# Patient Record
Sex: Male | Born: 2000 | Hispanic: No | Marital: Single | Smoking: Never smoker
Health system: Southern US, Community
[De-identification: ages and names within clinical notes are randomized; demographics above are authoritative.]

## PROBLEM LIST (undated history)

## (undated) DIAGNOSIS — E669 Obesity, unspecified: Secondary | ICD-10-CM

## (undated) DIAGNOSIS — M419 Scoliosis, unspecified: Secondary | ICD-10-CM

## (undated) HISTORY — DX: Scoliosis, unspecified: M41.9

## (undated) HISTORY — DX: Obesity, unspecified: E66.9

---
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
## 2019-12-14 IMAGING — CR DG CHEST 2V
2 series · 2 of 2 positions shown · non-contrast
Comparison: 12/17/2017 CT and plain film

CLINICAL DATA: Cough, fever, congestion

EXAM:
CHEST - 2 VIEW

[w chest pa]
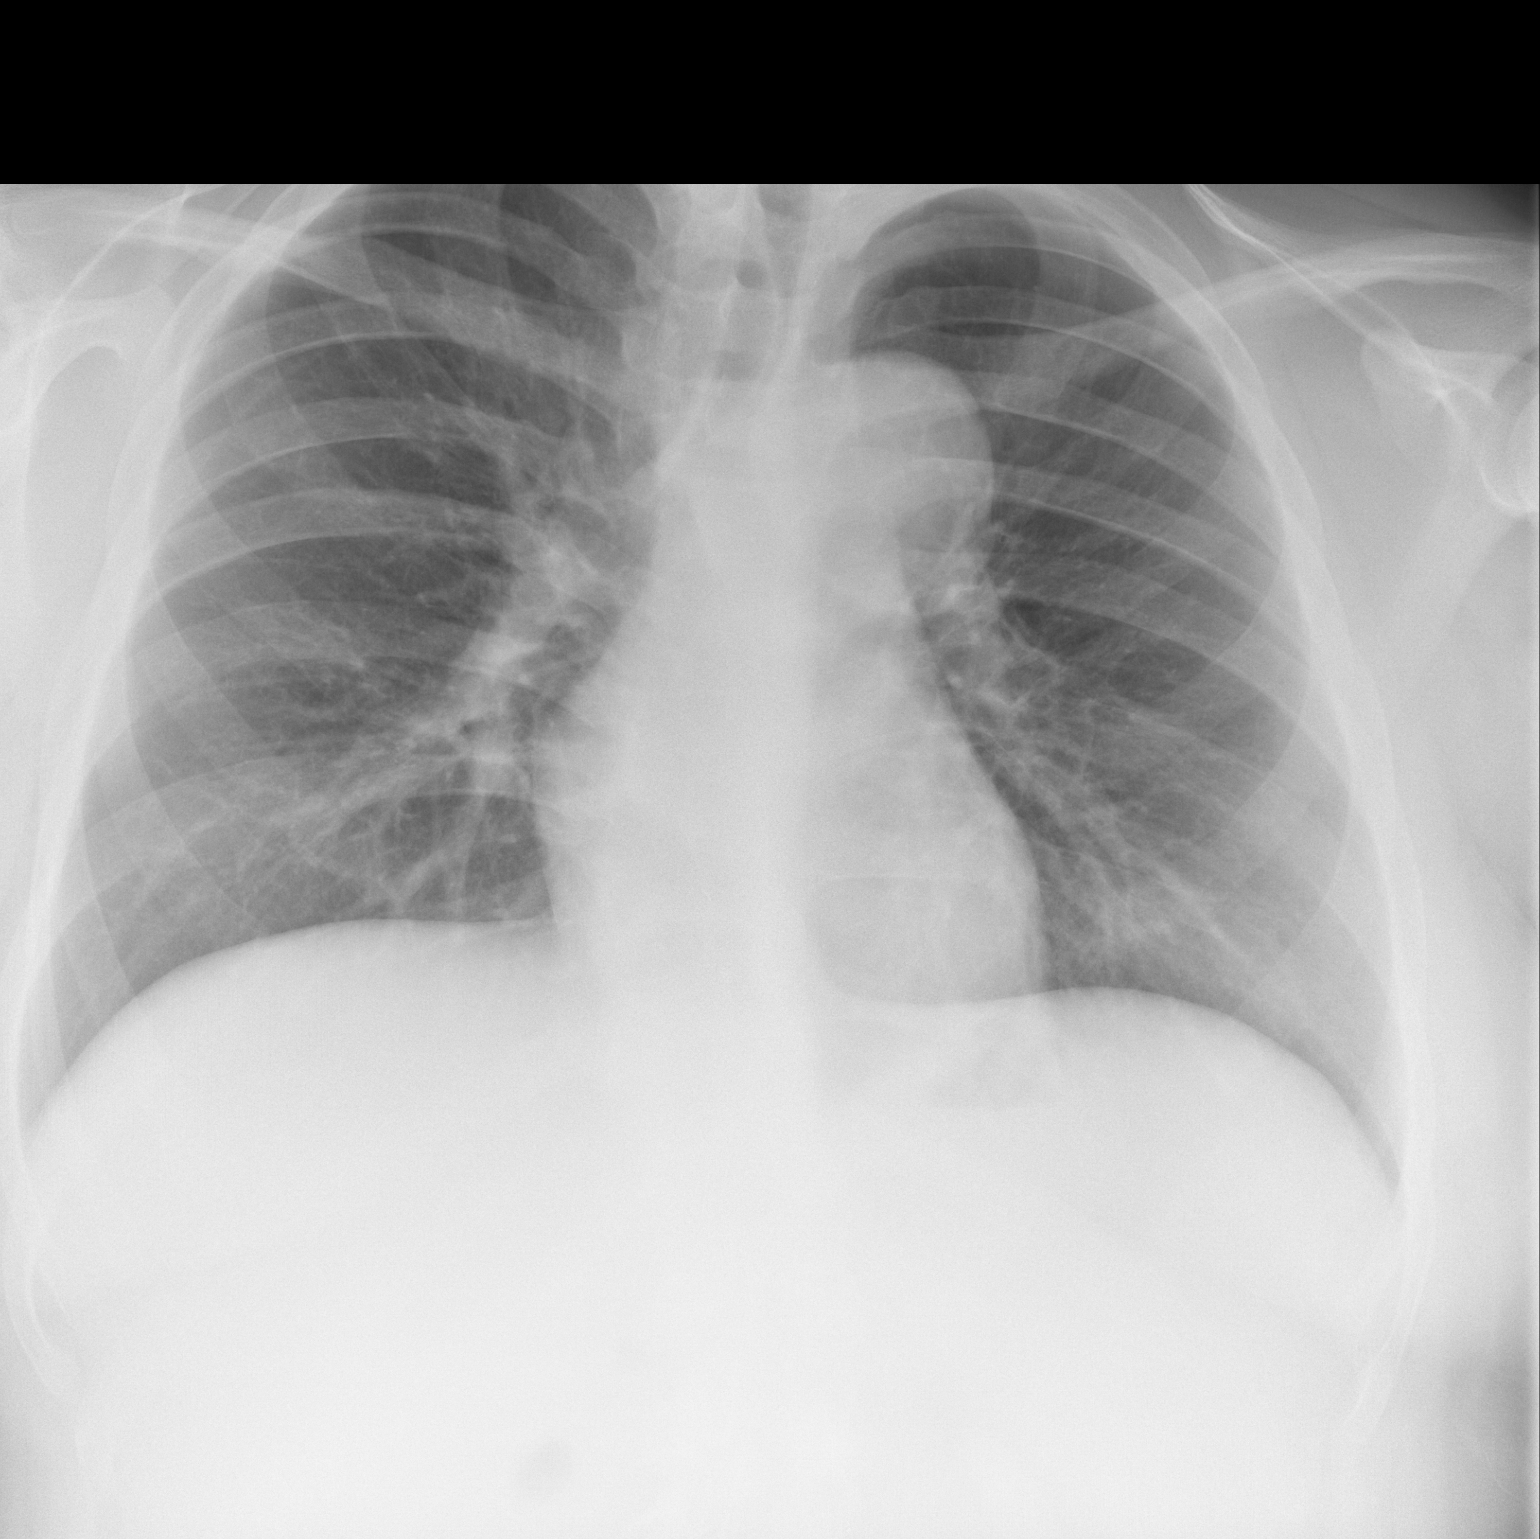

[w chest lat]
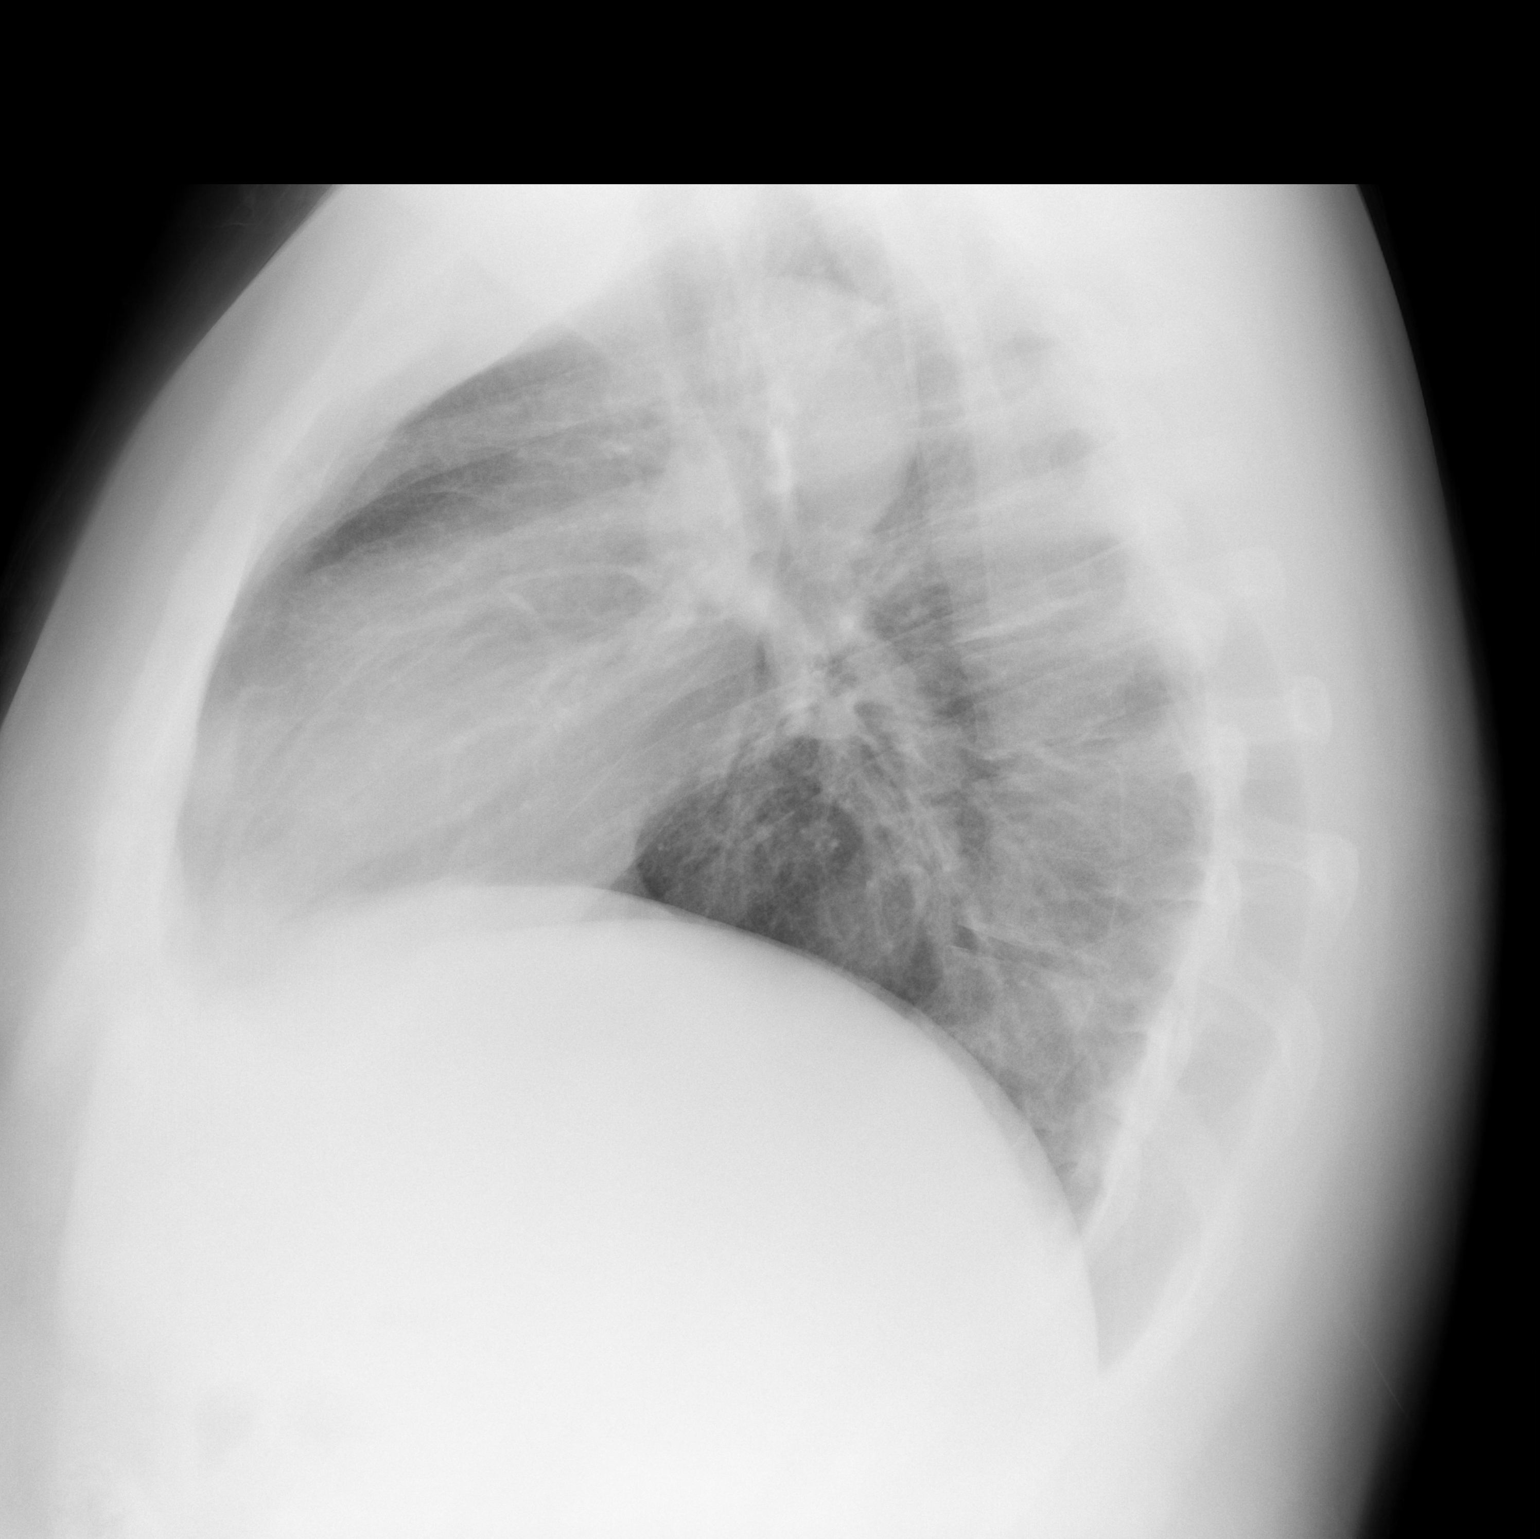

[2 of 2 positions shown; findings below may reference images not displayed]

FINDINGS: Mediastinal prominence in the region of the aortic arch is again
noted, shown on prior CT to represent a mediastinal mass, unchanged.
Heart is normal size. Lungs clear. No effusions or acute bony
abnormality.
IMPRESSION: Left mediastinal mass adjacent to the aortic arch is unchanged. No
acute cardiopulmonary disease.

## 2020-06-16 ENCOUNTER — Other Ambulatory Visit: Payer: Self-pay

## 2020-06-16 ENCOUNTER — Encounter (HOSPITAL_BASED_OUTPATIENT_CLINIC_OR_DEPARTMENT_OTHER): Payer: Self-pay | Admitting: *Deleted

## 2020-06-16 ENCOUNTER — Emergency Department (HOSPITAL_BASED_OUTPATIENT_CLINIC_OR_DEPARTMENT_OTHER)
Admission: EM | Admit: 2020-06-16 | Discharge: 2020-06-16 | Disposition: A | Discharge status: Home / Self Care | Payer: Medicaid Other | Attending: Emergency Medicine | Admitting: Emergency Medicine

## 2020-06-16 DIAGNOSIS — J039 Acute tonsillitis, unspecified: Secondary | ICD-10-CM | POA: Diagnosis not present

## 2020-06-16 DIAGNOSIS — J029 Acute pharyngitis, unspecified: Secondary | ICD-10-CM | POA: Diagnosis present

## 2020-06-16 LAB — MONONUCLEOSIS SCREEN: Mono Screen: NEGATIVE

## 2020-06-16 LAB — GROUP A STREP BY PCR: Group A Strep by PCR: NOT DETECTED

## 2020-06-16 MED ORDER — LIDOCAINE VISCOUS HCL 2 % MT SOLN: 15.0000 mL | Freq: Three times a day (TID) | OROMUCOSAL | 0 refills | Status: DC | PRN

## 2020-06-16 MED ORDER — LIDOCAINE VISCOUS HCL 2 % MT SOLN
15.0000 mL | Freq: Once | OROMUCOSAL | Status: AC
  Administered 2020-06-16: 15 mL via OROMUCOSAL
  Filled 2020-06-16: qty 15

## 2020-06-16 MED ORDER — IBUPROFEN 600 MG PO TABS: 600.0000 mg | ORAL_TABLET | Freq: Four times a day (QID) | ORAL | 0 refills | Status: DC | PRN

## 2020-06-16 MED ORDER — NAPROXEN 250 MG PO TABS
500.0000 mg | ORAL_TABLET | Freq: Once | ORAL | Status: AC
  Administered 2020-06-16: 500 mg via ORAL
  Filled 2020-06-16: qty 2

## 2020-06-16 MED ORDER — DEXAMETHASONE SODIUM PHOSPHATE 10 MG/ML IJ SOLN
10.0000 mg | Freq: Once | INTRAMUSCULAR | Status: AC
  Administered 2020-06-16: 10 mg via INTRAMUSCULAR
  Filled 2020-06-16: qty 1

## 2020-06-16 NOTE — ED Notes (Signed)
States 3 days ago began having a sore throat, poor appetite, low grade fever, decrease in energy level. Pain with swallowing, only been drinking tea and water. Visual inspection of throat indicates white pocket areas and redness also noted, uvula appears slightly swollen. States has a strong cough as well.

## 2020-06-16 NOTE — ED Provider Notes (Signed)
[facility] EMERGENCY DEPARTMENT Provider Note   CSN: [id number] Arrival date & time: 06/16/20  1944     History Chief Complaint  Patient presents with  . Sore Throat    Joe Cortez is a 20 y.o. male.  HPI    20 year old male comes in a chief complaint of sore throat. Patient has had a sore throat for the last 4 days.  He has had negative COVID test, flu test and strep test.  He continues to have significant discomfort in the back of his throat.  Pain does respond to Naprosyn.  Patient denies any sexual activity (no risk factors for STI).  He does indicate having some night sweats.  Patient has pain with swallowing, but able to swallow there is no difficulty in breathing.  Past Medical History:  Diagnosis Date  . Obesity   . Scoliosis 01/15/2014    Patient Active Problem List   Diagnosis Date Noted  . Hospital discharge follow-up 12/24/2017  . 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 Obesity 05/02/2015  . Scoliosis 01/15/2014    History reviewed. No pertinent surgical history.     No family history on file.  Social History   Tobacco Use  . Smoking status: Never Smoker  . Smokeless tobacco: Never Used  Substance Use Topics  . Alcohol use: No  . Drug use: No    Home Medications Prior to Admission medications   Medication Sig Start Date End Date Taking? Authorizing Provider  ibuprofen (ADVIL) 600 MG tablet Take 1 tablet (600 mg total) by mouth every 6 (six) hours as needed. 06/16/20  Yes Rozelia Catapano, MD  lidocaine (XYLOCAINE) 2 % solution Use as directed 15 mLs in the mouth or throat 3 (three) times daily as needed for mouth pain. 06/16/20  Yes Derwood Kaplan, MD    Allergies    Patient has no known allergies.  Review of Systems   Review of Systems  Constitutional: Positive for activity change.  HENT: Positive for sore throat. Negative for congestion.   Respiratory: Positive for cough. Negative for shortness of breath.   Cardiovascular: Negative for  chest pain.  Gastrointestinal: Negative for nausea and vomiting.  Allergic/Immunologic: Negative for immunocompromised state.  All other systems reviewed and are negative.   Physical Exam Updated Vital Signs BP 120/67   Pulse 89   Temp 99.1 F (37.3 C) (Oral)   Resp 16   Ht 6\' 5"  (1.956 m)   Wt (!) 141.5 kg   SpO2 100%   BMI 37.00 kg/m   Physical Exam Vitals and nursing note reviewed.  Constitutional:      Appearance: He is well-developed.  HENT:     Head: Atraumatic.     Nose:     Comments: Bilateral tonsillar exudate, left worse than right.    Mouth/Throat:     Mouth: Oral lesions present.     Pharynx: Oropharyngeal exudate and posterior oropharyngeal erythema present.  Cardiovascular:     Rate and Rhythm: Normal rate.  Pulmonary:     Effort: Pulmonary effort is normal.     Breath sounds: No stridor.  Musculoskeletal:     Cervical back: Neck supple.  Lymphadenopathy:     Cervical: Cervical adenopathy present.  Skin:    General: Skin is warm.  Neurological:     Mental Status: He is alert and oriented to person, place, and time.     ED Results / Procedures / Treatments   Labs (all labs ordered are listed, but only abnormal results  are displayed) Labs Reviewed  GROUP A STREP BY PCR  MONONUCLEOSIS SCREEN    EKG None  Radiology No results found.  Procedures Procedures   Medications Ordered in ED Medications  dexamethasone (DECADRON) injection 10 mg (10 mg Intramuscular Given 06/16/20 2237)  lidocaine (XYLOCAINE) 2 % viscous mouth solution 15 mL (15 mLs Mouth/Throat Given 06/16/20 2237)  naproxen (NAPROSYN) tablet 500 mg (500 mg Oral Given 06/16/20 2237)    ED Course  I have reviewed the triage vital signs and the nursing notes.  Pertinent labs & imaging results that were available during my care of the patient were reviewed by me and considered in my medical decision making (see chart for details).    MDM Rules/Calculators/A&P                           20 year old comes in a chief complaint of sore throat.  He is already tested negative for COVID, flu and had a rapid strep test in triage which is also negative.  He has cough, which makes strep unlikely.  Mono test added.  No STI risk factors, patient does not want to be tested for GC-chlamydia.  No red flags suggesting deep space infection, patient is immunocompetent.  Supportive treatment for now.  Advised strict ER return precautions.  Patient is already on antibiotics, advised that he finished the course that he is started.  Final Clinical Impression(s) / ED Diagnoses Final diagnoses:  Tonsillitis    Rx / DC Orders ED Discharge Orders         Ordered    ibuprofen (ADVIL) 600 MG tablet  Every 6 hours PRN        06/16/20 2240    lidocaine (XYLOCAINE) 2 % solution  3 times daily PRN        06/16/20 2240           Derwood Kaplan, MD 06/16/20 2245

## 2020-06-16 NOTE — ED Triage Notes (Signed)
C/o sore throat x 3 days.

## 2020-06-16 NOTE — Discharge Instructions (Signed)
You are seen in the ER for sore throat.  You have what appears to be tonsillitis.  Read the instructions provided on managing this condition.  Take the medications prescribed and complete salt water gargle at least twice a day.  In general, the care is supportive and your body will recover.  Return to the ER if you start having difficulty in breathing, drooling, severe headaches, confusion, inability to keep fluids down.

## 2020-09-07 ENCOUNTER — Telehealth: Payer: Self-pay

## 2020-09-07 NOTE — Telephone Encounter (Signed)
Called twice- no answer- LMOM.  Will try back, asked him to please answer the phone

## 2020-09-07 NOTE — Telephone Encounter (Signed)
Pt has been added to schedule ?

## 2020-09-07 NOTE — Telephone Encounter (Signed)
I called pt back and did reach him at another number He fees "like something sitting on my chest" He has noted this for 4 days He felt fatigued He is congested and has some cough  He is coughing up some mucus- he coughed hard today and there was a little blood in his spit He did take a covid test twice - negative - most recent test was last night  Offered to have him seen in the ER but he declines for now He would like to be seen tomorrow- will add him to my schedule at 10am

## 2020-09-07 NOTE — Progress Notes (Deleted)
[facility] [address] [place], [place] [address] [phone]  Date:  09/08/2020   Name:  Joe Cortez   DOB:  2000/03/03   MRN:  [id number]  PCP:  Pearline Cables, MD    Chief Complaint: No chief complaint on file.   History of Present Illness:  Joe Cortez is a 20 y.o. 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  I spoke with him on the phone as below-he also agreed to seek care at the ER if getting worse overnight: I called pt back and did reach him at another number He fees "like something sitting on my chest" He has noted this for 4 days He felt fatigued He is congested and has some cough He is coughing up some mucus- he coughed hard today and there was a little blood in his spit He did take a covid test twice - negative - most recent test was last night   Offered to have him seen in the ER but he declines for now He would like to be seen tomorrow- will add him to my schedule at 10am  Patient Active Problem List   Diagnosis Date Noted   Hospital discharge follow-up 12/24/2017   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  Social History   Tobacco Use   Smoking status: Never   Smokeless tobacco: Never  Substance Use Topics   Alcohol use: No   Drug use: No    No family history on file.  No Known Allergies  Medication list has been reviewed and updated.  Current Outpatient  Medications on File Prior to Visit  Medication Sig Dispense Refill   ibuprofen (ADVIL) 600 MG tablet Take 1 tablet (600 mg total) by mouth every 6 (six) hours as needed. 30 tablet 0   lidocaine (XYLOCAINE) 2 % solution Use as directed 15 mLs in the mouth or throat 3 (three) times daily as needed for mouth pain. 100 mL 0   No current facility-administered medications on file prior to visit.    Review of Systems:  As per HPI- otherwise negative.   Physical Examination: There were no vitals filed for this visit. There were no vitals filed for this visit. There is no height or weight on file to calculate BMI. Ideal Body Weight:    GEN: no acute distress. HEENT: Atraumatic, Normocephalic.  Ears and Nose: No external deformity. CV: RRR, No M/G/R. No JVD. No thrill. No extra heart sounds. PULM: CTA B, no wheezes, crackles, rhonchi. No retractions. No resp. distress. No accessory muscle use. ABD: S, NT, ND, +BS. No rebound. No HSM. EXTR: No c/c/e PSYCH: Normally interactive. Conversant.    Assessment and Plan: *** This visit occurred during the SARS-CoV-2 public health emergency.  Safety protocols were in place, including screening questions prior to the visit, additional usage of staff PPE, and extensive cleaning of exam room while observing appropriate contact  time as indicated for disinfecting solutions.   Signed Lamar Blinks, MD

## 2020-09-07 NOTE — Telephone Encounter (Signed)
Patient's mother called for appointment due to chest tinghness x 2 days.  Patient's mother advised Dr. Patsy Lager will like to talk to patient since he is an adult. Per mother he can be called at [phone].

## 2020-09-08 ENCOUNTER — Ambulatory Visit: Payer: Medicaid Other | Admitting: Family Medicine

## 2020-09-08 NOTE — Telephone Encounter (Signed)
Father called to cancel. Informed of Cancelation fee. He stated not to cancel and he would have his son call back

## 2020-09-08 NOTE — Telephone Encounter (Signed)
Called pt as he did not appear to his appt today.  He states that his mother was supposed to cancel this appt, he woke up this am feeling fine so he no longer wishes to/ needs to be sen

## 2020-09-25 ENCOUNTER — Emergency Department (HOSPITAL_BASED_OUTPATIENT_CLINIC_OR_DEPARTMENT_OTHER)
Admission: EM | Admit: 2020-09-25 | Discharge: 2020-09-25 | Disposition: A | Discharge status: Home / Self Care | Payer: Medicaid Other | Attending: Emergency Medicine | Admitting: Emergency Medicine

## 2020-09-25 ENCOUNTER — Encounter (HOSPITAL_BASED_OUTPATIENT_CLINIC_OR_DEPARTMENT_OTHER): Payer: Self-pay

## 2020-09-25 ENCOUNTER — Other Ambulatory Visit: Payer: Self-pay

## 2020-09-25 DIAGNOSIS — M7918 Myalgia, other site: Secondary | ICD-10-CM | POA: Diagnosis present

## 2020-09-25 DIAGNOSIS — G5701 Lesion of sciatic nerve, right lower limb: Secondary | ICD-10-CM | POA: Diagnosis not present

## 2020-09-25 MED ORDER — METHOCARBAMOL 500 MG PO TABS: 500.0000 mg | ORAL_TABLET | Freq: Two times a day (BID) | ORAL | 0 refills | Status: DC

## 2020-09-25 MED ORDER — PREDNISONE 10 MG PO TABS: 40.0000 mg | ORAL_TABLET | Freq: Every day | ORAL | 0 refills | Status: AC

## 2020-09-25 NOTE — ED Notes (Signed)
Pt discharged to home. Discharge instructions have been discussed with patient and/or family members. Pt verbally acknowledges understanding d/c instructions, and endorses comprehension to checkout at registration before leaving.  °

## 2020-09-25 NOTE — ED Provider Notes (Addendum)
[facility] EMERGENCY DEPARTMENT Provider Note   CSN: [id number] Arrival date & time: 09/25/20  1236     History Chief Complaint  Patient presents with   Leg Pain    Joe Cortez is a 20 y.o. male.  HPI Patient is a 20 year old male presenting today with right gluteal pain and weakness for the past 2 months.  He states that he plays football regularly with a league.  He has not played in about 2 months but it has been about 2 months since he is start having symptoms.  He states that he also drives a lot but rarely drives over 2 or 3 hours within a day or 2.  States that he drives around town relatively frequently.  He usually picks up his walks but states that he has had more discomfort in his right [place] with sitting recently seems to have discomfort that radiates down his right leg does not past the knee however.  He states that he has just over the past week or so felt some tingling sensations in his right leg.  Denies any weakness or numbness.  Denies any chest pain or shortness of breath no history of VTE.  Denies any trauma  No recent surgeries, hospitalization, long travel, hemoptysis, estrogen containing OCP, cancer history.  No unilateral leg swelling.  No history of PE or VTE.      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 No pertinent surgical history.     No family history on file.  Social History   Tobacco Use   Smoking status: Never   Smokeless tobacco: Never  Substance Use Topics   Alcohol use: No   Drug use: No    Home Medications Prior to Admission medications   Medication Sig Start Date End Date Taking? Authorizing Provider  methocarbamol (ROBAXIN) 500 MG tablet Take 1 tablet (500 mg total) by mouth 2 (two) times  daily. 09/25/20  Yes Kismet Facemire S, PA  predniSONE (DELTASONE) 10 MG tablet Take 4 tablets (40 mg total) by mouth daily for 3 days. 09/25/20 09/28/20 Yes Ethelmae Ringel S, PA  ibuprofen (ADVIL) 600 MG tablet Take 1 tablet (600 mg total) by mouth every 6 (six) hours as needed. 06/16/20   Derwood Kaplan, MD  lidocaine (XYLOCAINE) 2 % solution Use as directed 15 mLs in the mouth or throat 3 (three) times daily as needed for mouth pain. 06/16/20   Derwood Kaplan, MD    Allergies    Patient has no known allergies.  Review of Systems   Review of Systems  Constitutional:  Negative for chills and fever.  HENT:  Negative for congestion.   Respiratory:  Negative for shortness of breath.   Cardiovascular:  Negative for chest pain.  Gastrointestinal:  Negative for abdominal pain.  Musculoskeletal:  Negative for neck pain.       Right gluteal/right leg pain   Physical Exam Updated Vital Signs BP 121/77 (BP Location: Left Arm)   Pulse 93   Temp 98.4 F (36.9 C) (Oral)   Resp 18   Ht 6\' 5"  (1.956 m)   Wt (!) 151 kg   SpO2 95%   BMI 39.49 kg/m   Physical Exam Vitals and nursing note reviewed.  Constitutional:  General: He is not in acute distress.    Appearance: Normal appearance. He is not ill-appearing.  HENT:     Head: Normocephalic and atraumatic.  Eyes:     General: No scleral icterus.       Right eye: No discharge.        Left eye: No discharge.     Conjunctiva/sclera: Conjunctivae normal.  Cardiovascular:     Comments: Bilateral DP pulses 3+ and symmetric. Pulmonary:     Effort: Pulmonary effort is normal.     Breath sounds: No stridor.  Musculoskeletal:     Comments: Bilateral legs are symmetric.  No calf tenderness.  Skin:    General: Skin is warm and dry.     Comments: Since palpation of the right gluteal muscle.  Positive straight leg raise with pain radiating down past the knee.  Strength symmetric bilateral lower extremities.  Gait normal.  No disequilibrium  evident.  Neurological:     Mental Status: He is alert and oriented to person, place, and time. Mental status is at baseline.    ED Results / Procedures / Treatments   Labs (all labs ordered are listed, but only abnormal results are displayed) Labs Reviewed - No data to display  EKG None  Radiology No results found.  Procedures Procedures   Medications Ordered in ED Medications - No data to display  ED Course  I have reviewed the triage vital signs and the nursing notes.  Pertinent labs & imaging results that were available during my care of the patient were reviewed by me and considered in my medical decision making (see chart for details).    MDM Rules/Calculators/A&P                           Patient is 20 year old male presented today with radicular symptoms down right leg.  No central low back pain.  Ambulatory physical exam overall reassuring.  Does have some evidence of radicular pain with positive straight leg raise.  No central back pain making discrimination less likely.  Broad differential for back pain considered includes malignancy, disc herniation, spinal epidural abscess, spinal fracture, cauda equina, pyelonephritis, kidney stone, AAA, AD, pancreatitis, PE and PTX.   History without symptoms of urinary or stool retention or incontinence, neurologic changes such as sensation change or weakness lower extremities, coagulopathy or blood thinner use, is not elderly or with history of osteoporosis, denies any history of cancer, fever, IV drug use, weight changes (unexplained), or prolonged steroid use.   Physical exam most consistent with muscular strain versus piriformis versus MSK pathology. Doubt cauda equina or disc herniation d/t lack of saddle anesthesia/bowel or bladder incontinence or urinary retention, normal gait and reassuring physical examination without neurologic deficits.   History is not supportive of kidney stone, AAA, AD, pancreatitis, PE or PTX.  Patient has no CVA tenderness or urinary sx to suggest pyelonephritis or kidney stone.   Will manage patient conservatively at this time. NSAIDs, back exercises/stretches, heat therapy and follow up with PCP if symptoms do not resolve in 3-4 weeks. Patient offered muscle relaxer for comfort at night. Counseled on need to return to ED for fever, worsening or concerning symptoms. Patient agreeable to plan and states understanding of follow up plans and return precautions.    Vital signs within normal limits.  Discussed with patient the need to not use right back pocket for wallet or any other devices.  Recommend stretching warm compresses Tylenol  ibuprofen.  Brief course of prednisone and muscle relaxer.  He will follow-up with [facility].  May benefit from physical therapy.  Given return precautions occluding lateral leg swelling concerning for DVT however he has no evidence of this today and have low suspicion for this.  Final Clinical Impression(s) / ED Diagnoses Final diagnoses:  Piriformis syndrome of right side    Rx / DC Orders ED Discharge Orders          Ordered    predniSONE (DELTASONE) 10 MG tablet  Daily        09/25/20 1523    methocarbamol (ROBAXIN) 500 MG tablet  2 times daily        09/25/20 1523             Gailen Shelter, [place] 09/25/20 1528    Gailen Shelter, [place] 09/25/20 1530    Charlynne Pander, MD 09/25/20 [phone]

## 2020-09-25 NOTE — Discharge Instructions (Addendum)
Your symptoms today are very concerning for piriformis syndrome or a muscle strain in your gluteus muscle.  Please use Tylenol and ibuprofen as discussed below.  Warm compresses, gentle stretching, patient does that help stretch that area that we discussed.  Follow-up with [facility].  He may benefit from physical therapy.  If you experience leg swelling return to the ER immediately as we discussed a very low suspicion for blood clot however swelling in 1 leg would be concerning for this.  Please use Tylenol or ibuprofen for pain.  You may use 600 mg ibuprofen every 6 hours or 1000 mg of Tylenol every 6 hours.  You may choose to alternate between the 2.  This would be most effective.  Not to exceed 4 g of Tylenol within 24 hours.  Not to exceed 3200 mg ibuprofen 24 hours.    I have also prescribed you a muscle relaxer and medications all prednisone which she will take for 3 days to try to take the swelling down.  It appears that you are already established with a primary care provider.  Please follow-up with them I have attached their information to your discharge instructions.

## 2020-09-25 NOTE — ED Triage Notes (Signed)
Pt arrives with c/o pain to right leg starting behind glute about a month ago pt reports over the past week his leg started feeling tingly.

## 2020-12-05 NOTE — Progress Notes (Deleted)
[facility] [address], [address] [place], [place] [address] [phone]  Date:  12/07/2020   Name:  Jasson Siegmann   DOB:  2000/08/06   MRN:  [id number]  PCP:  Pearline Cables, MD    Chief Complaint: No chief complaint on file.   History of Present Illness:  Jeshurun Oaxaca is a 20 y.o. 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  Social History   Tobacco Use   Smoking status: Never   Smokeless tobacco: Never  Substance Use Topics   Alcohol use: No   Drug use: No    No family history on file.  No Known Allergies  Medication list has been reviewed and updated.  Current Outpatient Medications on File Prior to Visit  Medication Sig Dispense Refill   ibuprofen (ADVIL) 600 MG tablet Take 1 tablet (600 mg total) by mouth every 6 (six) hours as needed. 30 tablet 0   lidocaine (XYLOCAINE) 2 % solution Use as directed 15 mLs in the mouth or throat 3 (three) times daily as needed for mouth pain. 100 mL 0   methocarbamol (ROBAXIN) 500 MG tablet Take 1 tablet (500 mg total) by mouth 2 (two) times daily. 20 tablet 0   No current facility-administered medications on file prior to visit.    Review of Systems:  As per HPI- otherwise negative.   Physical Examination: There were no vitals filed for this visit. There were no vitals filed for this visit. There is no height or weight on file to calculate BMI. Ideal Body Weight:    GEN: no  acute distress. HEENT: Atraumatic, Normocephalic.  Ears and Nose: No external deformity. CV: RRR, No M/G/R. No JVD. No thrill. No extra heart sounds. PULM: CTA B, no wheezes, crackles, rhonchi. No retractions. No resp. distress. No accessory muscle use. ABD: S, NT, ND, +BS. No rebound. No HSM. EXTR: No c/c/e PSYCH: Normally interactive. Conversant.    Assessment and Plan: ***  Signed Abbe Amsterdam, MD

## 2020-12-07 ENCOUNTER — Encounter: Payer: Medicaid Other | Admitting: Family Medicine

## 2020-12-08 ENCOUNTER — Encounter: Payer: Self-pay | Admitting: Family Medicine

## 2021-01-25 ENCOUNTER — Emergency Department (HOSPITAL_COMMUNITY): Payer: Medicaid Other | Admitting: Anesthesiology

## 2021-01-25 ENCOUNTER — Encounter (HOSPITAL_COMMUNITY)
Admission: EM | Disposition: A | Discharge status: Home / Self Care | Payer: Self-pay | Source: Home / Self Care | Attending: Emergency Medicine

## 2021-01-25 ENCOUNTER — Other Ambulatory Visit: Payer: Self-pay

## 2021-01-25 ENCOUNTER — Encounter (HOSPITAL_BASED_OUTPATIENT_CLINIC_OR_DEPARTMENT_OTHER): Payer: Self-pay | Admitting: Emergency Medicine

## 2021-01-25 ENCOUNTER — Emergency Department (HOSPITAL_BASED_OUTPATIENT_CLINIC_OR_DEPARTMENT_OTHER): Payer: Medicaid Other

## 2021-01-25 ENCOUNTER — Ambulatory Visit (HOSPITAL_BASED_OUTPATIENT_CLINIC_OR_DEPARTMENT_OTHER)
Admission: EM | Admit: 2021-01-25 | Discharge: 2021-01-25 | Disposition: A | Discharge status: Home / Self Care | Payer: Medicaid Other | Attending: Surgery | Admitting: Surgery

## 2021-01-25 DIAGNOSIS — K353 Acute appendicitis with localized peritonitis, without perforation or gangrene: Secondary | ICD-10-CM | POA: Diagnosis present

## 2021-01-25 DIAGNOSIS — K3533 Acute appendicitis with perforation and localized peritonitis, with abscess: Secondary | ICD-10-CM | POA: Diagnosis not present

## 2021-01-25 DIAGNOSIS — Z6838 Body mass index (BMI) 38.0-38.9, adult: Secondary | ICD-10-CM | POA: Insufficient documentation

## 2021-01-25 DIAGNOSIS — Z20822 Contact with and (suspected) exposure to covid-19: Secondary | ICD-10-CM | POA: Insufficient documentation

## 2021-01-25 DIAGNOSIS — E669 Obesity, unspecified: Secondary | ICD-10-CM | POA: Insufficient documentation

## 2021-01-25 HISTORY — PX: LAPAROSCOPIC APPENDECTOMY: SHX408

## 2021-01-25 LAB — COMPREHENSIVE METABOLIC PANEL
ALT: 24 U/L (ref 0–44)
AST: 19 U/L (ref 15–41)
Albumin: 4.2 g/dL (ref 3.5–5.0)
Alkaline Phosphatase: 59 U/L (ref 38–126)
Anion gap: 11 (ref 5–15)
BUN: 20 mg/dL (ref 6–20)
CO2: 23 mmol/L (ref 22–32)
Calcium: 9.3 mg/dL (ref 8.9–10.3)
Chloride: 102 mmol/L (ref 98–111)
Creatinine, Ser: 0.78 mg/dL (ref 0.61–1.24)
GFR, Estimated: >60 mL/min (ref >60)
Glucose, Bld: 104 mg/dL — ABNORMAL HIGH (ref 70–99)
Potassium: 3.7 mmol/L (ref 3.5–5.1)
Sodium: 136 mmol/L (ref 135–145)
Total Bilirubin: 0.7 mg/dL (ref 0.3–1.2)
Total Protein: 7.9 g/dL (ref 6.5–8.1)

## 2021-01-25 LAB — CBC WITH DIFFERENTIAL/PLATELET
Abs Immature Granulocytes: 0.02 10*3/uL (ref 0.00–0.07)
Basophils Absolute: 0 10*3/uL (ref 0.0–0.1)
Basophils Relative: 0 %
Eosinophils Absolute: 0 10*3/uL (ref 0.0–0.5)
Eosinophils Relative: 0 %
HCT: 54.8 % — ABNORMAL HIGH (ref 39.0–52.0)
Hemoglobin: 18.1 g/dL — ABNORMAL HIGH (ref 13.0–17.0)
Immature Granulocytes: 0 %
Lymphocytes Relative: 17 %
Lymphs Abs: 1.4 10*3/uL (ref 0.7–4.0)
MCH: 26.2 pg (ref 26.0–34.0)
MCHC: 33 g/dL (ref 30.0–36.0)
MCV: 79.2 fL — ABNORMAL LOW (ref 80.0–100.0)
Monocytes Absolute: 0.6 10*3/uL (ref 0.1–1.0)
Monocytes Relative: 7 %
Neutro Abs: 6.3 10*3/uL (ref 1.7–7.7)
Neutrophils Relative %: 76 %
Platelets: 184 10*3/uL (ref 150–400)
RBC: 6.92 MIL/uL — ABNORMAL HIGH (ref 4.22–5.81)
RDW: 13.8 % (ref 11.5–15.5)
WBC: 8.4 10*3/uL (ref 4.0–10.5)
nRBC: 0 % (ref 0.0–0.2)

## 2021-01-25 LAB — URINALYSIS, ROUTINE W REFLEX MICROSCOPIC
Bilirubin Urine: NEGATIVE
Glucose, UA: NEGATIVE mg/dL
Ketones, ur: NEGATIVE mg/dL
Leukocytes,Ua: NEGATIVE
Nitrite: NEGATIVE
Protein, ur: NEGATIVE mg/dL
Specific Gravity, Urine: 1.03 (ref 1.005–1.030)
pH: 6 (ref 5.0–8.0)

## 2021-01-25 LAB — LIPASE, BLOOD: Lipase: 39 U/L (ref 11–51)

## 2021-01-25 LAB — URINALYSIS, MICROSCOPIC (REFLEX)

## 2021-01-25 LAB — RESP PANEL BY RT-PCR (FLU A&B, COVID) ARPGX2
Influenza A by PCR: NEGATIVE
Influenza B by PCR: NEGATIVE
SARS Coronavirus 2 by RT PCR: NEGATIVE

## 2021-01-25 SURGERY — APPENDECTOMY, LAPAROSCOPIC
Anesthesia: General | Site: Abdomen

## 2021-01-25 MED ORDER — ONDANSETRON HCL 4 MG/2ML IJ SOLN
INTRAMUSCULAR | Status: DC | PRN
  Administered 2021-01-25: 4 mg via INTRAVENOUS

## 2021-01-25 MED ORDER — CHLORHEXIDINE GLUCONATE 0.12 % MT SOLN
OROMUCOSAL | Status: AC
  Administered 2021-01-25: 13:00:00 15 mL via OROMUCOSAL
  Filled 2021-01-25: qty 15

## 2021-01-25 MED ORDER — IBUPROFEN 600 MG PO TABS: 600.0000 mg | ORAL_TABLET | Freq: Four times a day (QID) | ORAL | 0 refills | Status: DC | PRN

## 2021-01-25 MED ORDER — SCOPOLAMINE 1 MG/3DAYS TD PT72
1.0000 | MEDICATED_PATCH | TRANSDERMAL | Status: DC
  Administered 2021-01-25: 14:00:00 1.5 mg via TRANSDERMAL
  Filled 2021-01-25: qty 1

## 2021-01-25 MED ORDER — ROCURONIUM BROMIDE 10 MG/ML (PF) SYRINGE
PREFILLED_SYRINGE | INTRAVENOUS | Status: DC | PRN
  Administered 2021-01-25: 100 mg via INTRAVENOUS

## 2021-01-25 MED ORDER — SUGAMMADEX SODIUM 200 MG/2ML IV SOLN
INTRAVENOUS | Status: DC | PRN
  Administered 2021-01-25: 500 mg via INTRAVENOUS

## 2021-01-25 MED ORDER — DEXAMETHASONE SODIUM PHOSPHATE 10 MG/ML IJ SOLN
INTRAMUSCULAR | Status: DC | PRN
  Administered 2021-01-25: 10 mg via INTRAVENOUS

## 2021-01-25 MED ORDER — SODIUM CHLORIDE 0.9 % IV SOLN
2.0000 g | Freq: Once | INTRAVENOUS | Status: AC
  Administered 2021-01-25: 11:00:00 2 g via INTRAVENOUS
  Filled 2021-01-25: qty 20

## 2021-01-25 MED ORDER — FENTANYL CITRATE (PF) 250 MCG/5ML IJ SOLN
INTRAMUSCULAR | Status: AC
  Filled 2021-01-25: qty 5

## 2021-01-25 MED ORDER — ORAL CARE MOUTH RINSE: 15.0000 mL | Freq: Once | OROMUCOSAL | Status: AC

## 2021-01-25 MED ORDER — ACETAMINOPHEN 500 MG PO TABS
1000.0000 mg | ORAL_TABLET | Freq: Once | ORAL | Status: AC
  Administered 2021-01-25: 13:00:00 1000 mg via ORAL
  Filled 2021-01-25: qty 2

## 2021-01-25 MED ORDER — FENTANYL CITRATE PF 50 MCG/ML IJ SOSY
50.0000 ug | PREFILLED_SYRINGE | Freq: Once | INTRAMUSCULAR | Status: AC
  Administered 2021-01-25: 11:00:00 50 ug via INTRAVENOUS
  Filled 2021-01-25: qty 1

## 2021-01-25 MED ORDER — FENTANYL CITRATE (PF) 250 MCG/5ML IJ SOLN
INTRAMUSCULAR | Status: DC | PRN
  Administered 2021-01-25 (×2): 50 ug via INTRAVENOUS
  Administered 2021-01-25: 100 ug via INTRAVENOUS
  Administered 2021-01-25 (×3): 50 ug via INTRAVENOUS

## 2021-01-25 MED ORDER — APREPITANT 40 MG PO CAPS
40.0000 mg | ORAL_CAPSULE | Freq: Once | ORAL | Status: AC
  Administered 2021-01-25: 14:00:00 40 mg via ORAL
  Filled 2021-01-25 (×2): qty 1

## 2021-01-25 MED ORDER — DEXMEDETOMIDINE (PRECEDEX) IN NS 20 MCG/5ML (4 MCG/ML) IV SYRINGE
PREFILLED_SYRINGE | INTRAVENOUS | Status: AC
  Filled 2021-01-25: qty 10

## 2021-01-25 MED ORDER — PROPOFOL 10 MG/ML IV BOLUS
INTRAVENOUS | Status: AC
  Filled 2021-01-25: qty 20

## 2021-01-25 MED ORDER — ONDANSETRON HCL 4 MG/2ML IJ SOLN
INTRAMUSCULAR | Status: AC
  Filled 2021-01-25: qty 2

## 2021-01-25 MED ORDER — PROMETHAZINE HCL 25 MG/ML IJ SOLN
6.2500 mg | Freq: Once | INTRAMUSCULAR | Status: AC
  Administered 2021-01-25: 16:00:00 6.25 mg via INTRAVENOUS

## 2021-01-25 MED ORDER — LACTATED RINGERS IV SOLN: INTRAVENOUS | Status: DC

## 2021-01-25 MED ORDER — DEXMEDETOMIDINE (PRECEDEX) IN NS 20 MCG/5ML (4 MCG/ML) IV SYRINGE
PREFILLED_SYRINGE | INTRAVENOUS | Status: DC | PRN
  Administered 2021-01-25: 8 ug via INTRAVENOUS
  Administered 2021-01-25: 4 ug via INTRAVENOUS
  Administered 2021-01-25: 20 ug via INTRAVENOUS
  Administered 2021-01-25: 8 ug via INTRAVENOUS

## 2021-01-25 MED ORDER — ACETAMINOPHEN 500 MG PO TABS: 500.0000 mg | ORAL_TABLET | Freq: Four times a day (QID) | ORAL | Status: DC | PRN

## 2021-01-25 MED ORDER — SUGAMMADEX SODIUM 500 MG/5ML IV SOLN
INTRAVENOUS | Status: AC
  Filled 2021-01-25: qty 5

## 2021-01-25 MED ORDER — TRAMADOL HCL 50 MG PO TABS: 50.0000 mg | ORAL_TABLET | Freq: Four times a day (QID) | ORAL | 0 refills | Status: AC | PRN

## 2021-01-25 MED ORDER — ROCURONIUM BROMIDE 10 MG/ML (PF) SYRINGE
PREFILLED_SYRINGE | INTRAVENOUS | Status: AC
  Filled 2021-01-25: qty 10

## 2021-01-25 MED ORDER — PROMETHAZINE HCL 25 MG/ML IJ SOLN
INTRAMUSCULAR | Status: AC
  Filled 2021-01-25: qty 1

## 2021-01-25 MED ORDER — PHENYLEPHRINE 40 MCG/ML (10ML) SYRINGE FOR IV PUSH (FOR BLOOD PRESSURE SUPPORT)
PREFILLED_SYRINGE | INTRAVENOUS | Status: AC
  Filled 2021-01-25: qty 10

## 2021-01-25 MED ORDER — FENTANYL CITRATE PF 50 MCG/ML IJ SOSY
50.0000 ug | PREFILLED_SYRINGE | Freq: Once | INTRAMUSCULAR | Status: AC
  Administered 2021-01-25: 10:00:00 50 ug via INTRAVENOUS
  Filled 2021-01-25: qty 1

## 2021-01-25 MED ORDER — MIDAZOLAM HCL 2 MG/2ML IJ SOLN
INTRAMUSCULAR | Status: AC
  Filled 2021-01-25: qty 2

## 2021-01-25 MED ORDER — 0.9 % SODIUM CHLORIDE (POUR BTL) OPTIME
TOPICAL | Status: DC | PRN
  Administered 2021-01-25: 15:00:00 1000 mL

## 2021-01-25 MED ORDER — MIDAZOLAM HCL 2 MG/2ML IJ SOLN
INTRAMUSCULAR | Status: DC | PRN
  Administered 2021-01-25: 2 mg via INTRAVENOUS

## 2021-01-25 MED ORDER — METRONIDAZOLE 500 MG/100ML IV SOLN
500.0000 mg | Freq: Once | INTRAVENOUS | Status: AC
  Administered 2021-01-25: 11:00:00 500 mg via INTRAVENOUS
  Filled 2021-01-25: qty 100

## 2021-01-25 MED ORDER — LIDOCAINE 2% (20 MG/ML) 5 ML SYRINGE
INTRAMUSCULAR | Status: AC
  Filled 2021-01-25: qty 5

## 2021-01-25 MED ORDER — ONDANSETRON HCL 4 MG/2ML IJ SOLN
4.0000 mg | Freq: Once | INTRAMUSCULAR | Status: AC
  Administered 2021-01-25: 10:00:00 4 mg via INTRAVENOUS
  Filled 2021-01-25: qty 2

## 2021-01-25 MED ORDER — CHLORHEXIDINE GLUCONATE 0.12 % MT SOLN: 15.0000 mL | Freq: Once | OROMUCOSAL | Status: AC

## 2021-01-25 MED ORDER — PHENYLEPHRINE 40 MCG/ML (10ML) SYRINGE FOR IV PUSH (FOR BLOOD PRESSURE SUPPORT)
PREFILLED_SYRINGE | INTRAVENOUS | Status: DC | PRN
  Administered 2021-01-25: 120 ug via INTRAVENOUS

## 2021-01-25 MED ORDER — BUPIVACAINE-EPINEPHRINE (PF) 0.25% -1:200000 IJ SOLN
INTRAMUSCULAR | Status: AC
  Filled 2021-01-25: qty 30

## 2021-01-25 MED ORDER — BUPIVACAINE-EPINEPHRINE 0.25% -1:200000 IJ SOLN
INTRAMUSCULAR | Status: DC | PRN
  Administered 2021-01-25: 30 mL

## 2021-01-25 MED ORDER — IOHEXOL 300 MG/ML  SOLN
100.0000 mL | Freq: Once | INTRAMUSCULAR | Status: AC | PRN
  Administered 2021-01-25: 10:00:00 100 mL via INTRAVENOUS

## 2021-01-25 MED ORDER — LIDOCAINE 2% (20 MG/ML) 5 ML SYRINGE
INTRAMUSCULAR | Status: DC | PRN
  Administered 2021-01-25: 60 mg via INTRAVENOUS

## 2021-01-25 MED ORDER — FENTANYL CITRATE (PF) 100 MCG/2ML IJ SOLN: 25.0000 ug | INTRAMUSCULAR | Status: DC | PRN

## 2021-01-25 MED ORDER — PROPOFOL 10 MG/ML IV BOLUS
INTRAVENOUS | Status: DC | PRN
  Administered 2021-01-25: 30 mg via INTRAVENOUS
  Administered 2021-01-25: 100 mg via INTRAVENOUS
  Administered 2021-01-25: 20 mg via INTRAVENOUS
  Administered 2021-01-25: 100 mg via INTRAVENOUS
  Administered 2021-01-25: 200 mg via INTRAVENOUS
  Administered 2021-01-25: 30 mg via INTRAVENOUS

## 2021-01-25 MED ORDER — DEXAMETHASONE SODIUM PHOSPHATE 10 MG/ML IJ SOLN
INTRAMUSCULAR | Status: AC
  Filled 2021-01-25: qty 1

## 2021-01-25 SURGICAL SUPPLY — 53 items
ADH SKN CLS APL DERMABOND .7 (GAUZE/BANDAGES/DRESSINGS) ×1
ADH SKN CLS LQ APL DERMABOND (GAUZE/BANDAGES/DRESSINGS) ×1
APL PRP STRL LF DISP 70% ISPRP (MISCELLANEOUS) ×1
APPLIER CLIP ROT 10 11.4 M/L (STAPLE)
APR CLP MED LRG 11.4X10 (STAPLE)
BAG COUNTER SPONGE SURGICOUNT (BAG) ×3 IMPLANT
BAG SPEC RTRVL LRG 6X4 10 (ENDOMECHANICALS) ×1
BAG SPNG CNTER NS LX DISP (BAG) ×1
BLADE CLIPPER SURG (BLADE) IMPLANT
CANISTER SUCT 3000ML PPV (MISCELLANEOUS) IMPLANT
CHLORAPREP W/TINT 26 (MISCELLANEOUS) ×3 IMPLANT
CLIP APPLIE ROT 10 11.4 M/L (STAPLE) IMPLANT
COVER SURGICAL LIGHT HANDLE (MISCELLANEOUS) ×3 IMPLANT
CUTTER FLEX LINEAR 45M (STAPLE) ×3 IMPLANT
DERMABOND ADHESIVE PROPEN (GAUZE/BANDAGES/DRESSINGS) ×1
DERMABOND ADVANCED (GAUZE/BANDAGES/DRESSINGS) ×1
DERMABOND ADVANCED .7 DNX12 (GAUZE/BANDAGES/DRESSINGS) IMPLANT
DERMABOND ADVANCED .7 DNX6 (GAUZE/BANDAGES/DRESSINGS) ×2 IMPLANT
ELECT CAUTERY BLADE 6.4 (BLADE) ×3 IMPLANT
ELECT REM PT RETURN 9FT ADLT (ELECTROSURGICAL) ×2
ELECTRODE REM PT RTRN 9FT ADLT (ELECTROSURGICAL) ×2 IMPLANT
ENDOLOOP SUT PDS II  0 18 (SUTURE)
ENDOLOOP SUT PDS II 0 18 (SUTURE) IMPLANT
GLOVE SURG ENC MOIS LTX SZ6.5 (GLOVE) ×3 IMPLANT
GLOVE SURG UNDER POLY LF SZ6 (GLOVE) ×3 IMPLANT
GOWN STRL REUS W/ TWL LRG LVL3 (GOWN DISPOSABLE) ×6 IMPLANT
GOWN STRL REUS W/TWL LRG LVL3 (GOWN DISPOSABLE) ×6
KIT BASIN OR (CUSTOM PROCEDURE TRAY) ×3 IMPLANT
KIT TURNOVER KIT B (KITS) ×3 IMPLANT
NDL INSUFFLATION 14GA 120MM (NEEDLE) IMPLANT
NEEDLE INSUFFLATION 14GA 120MM (NEEDLE) IMPLANT
NS IRRIG 1000ML POUR BTL (IV SOLUTION) ×3 IMPLANT
PAD ARMBOARD 7.5X6 YLW CONV (MISCELLANEOUS) ×6 IMPLANT
PENCIL BUTTON HOLSTER BLD 10FT (ELECTRODE) ×3 IMPLANT
POUCH SPECIMEN RETRIEVAL 10MM (ENDOMECHANICALS) ×3 IMPLANT
RELOAD 45 VASCULAR/THIN (ENDOMECHANICALS) IMPLANT
RELOAD STAPLE 45 2.5 WHT GRN (ENDOMECHANICALS) IMPLANT
RELOAD STAPLE 45 3.5 BLU ETS (ENDOMECHANICALS) IMPLANT
RELOAD STAPLE TA45 3.5 REG BLU (ENDOMECHANICALS) IMPLANT
SCISSORS LAP 5X35 DISP (ENDOMECHANICALS) IMPLANT
SET IRRIG TUBING LAPAROSCOPIC (IRRIGATION / IRRIGATOR) IMPLANT
SET TUBE SMOKE EVAC HIGH FLOW (TUBING) ×3 IMPLANT
SHEARS HARMONIC ACE PLUS 36CM (ENDOMECHANICALS) IMPLANT
SLEEVE ENDOPATH XCEL 5M (ENDOMECHANICALS) ×3 IMPLANT
SPECIMEN JAR SMALL (MISCELLANEOUS) ×3 IMPLANT
SUT MNCRL AB 4-0 PS2 18 (SUTURE) ×3 IMPLANT
SUT VICRYL 0 UR6 27IN ABS (SUTURE) IMPLANT
TOWEL GREEN STERILE (TOWEL DISPOSABLE) ×3 IMPLANT
TOWEL GREEN STERILE FF (TOWEL DISPOSABLE) ×3 IMPLANT
TRAY LAPAROSCOPIC MC (CUSTOM PROCEDURE TRAY) ×3 IMPLANT
TROCAR XCEL BLUNT TIP 100MML (ENDOMECHANICALS) IMPLANT
TROCAR XCEL NON-BLD 5MMX100MML (ENDOMECHANICALS) ×3 IMPLANT
WATER STERILE IRR 1000ML POUR (IV SOLUTION) ×3 IMPLANT

## 2021-01-25 NOTE — Progress Notes (Signed)
Patient seen and examined. Acute appendicitis, plan for lap appy. Informed consent was obtained after detailed explanation of risks, including bleeding, infection, abscess, staple line leak, stump appendicitis, injury to surrounding structures, and need for conversion to open procedure. All questions answered to the patient's satisfaction.  Diamantina Monks, MD General and Trauma Surgery [facility]

## 2021-01-25 NOTE — H&P (Signed)
H&P Note  Joe Cortez [birth year]  [id number].    Requesting MD: Marianna Fuss, MD Chief Complaint/Reason for Consult:Acute appendicitis  HPI:  Patient is a 20 year old male who presented to [facility] with abdominal pain that he awoke with this AM. Pain is periumbilical/ right sided and moderate. Worse with deep inspiration or palpation. Pain feels like an aching sensation and does not radiate. Associated with nausea and vomiting. He had chills but no fever. Denies chest pain, SOB, urinary symptoms. He does also report a cough; this is chronic and happens every morning, mucinex helps. PMH significant for obesity and scoliosis. He has had no prior abdominal surgeries. NKDA.  In school at [employer].  ROS: Review of Systems  Constitutional:  Negative for chills and fever.  Respiratory:  Negative for shortness of breath and wheezing.   Cardiovascular:  Negative for chest pain and palpitations.  Gastrointestinal:  Positive for abdominal pain, nausea and vomiting. Negative for constipation and diarrhea.  Genitourinary:  Negative for dysuria, frequency and urgency.  All other systems reviewed and are negative.  No family history on file.  Past Medical History:  Diagnosis Date   Obesity    Scoliosis 01/15/2014    History reviewed. No pertinent surgical history.  Social History:  reports that he has never smoked. He has never used smokeless tobacco. He reports that he does not drink alcohol and does not use drugs.  Allergies: No Known Allergies  (Not in a hospital admission)   Blood pressure 139/84, pulse 66, temperature 98.1 F (36.7 C), temperature source Oral, resp. rate 18, height 6\' 5"  (1.956 m), weight (!) 144.2 kg, SpO2 99 %. Physical Exam:  General: pleasant, WD, male who is laying in bed in NAD HEENT: head is normocephalic, atraumatic.  Sclera are noninjected.  Pupils equal and round.  Ears and nose without any masses or lesions.  Mouth is pink and moist Heart: regular,  rate, and rhythm.  Normal s1,s2. No obvious murmurs, gallops, or rubs noted.  Palpable radial and pedal pulses bilaterally Lungs: CTAB, no wheezes, rhonchi, or rales noted.  Respiratory effort nonlabored Abd: soft, ND, +BS, no masses, hernias, or organomegaly. TTP RLQ with voluntary guarding, no peritonitis  MS: all 4 extremities are symmetrical with no cyanosis, clubbing, or edema. Skin: warm and dry with no masses, lesions, or rashes Neuro: Cranial nerves 2-12 grossly intact, sensation is normal throughout Psych: A&Ox3 with an appropriate affect.   Results for orders placed or performed during the hospital encounter of 01/25/21 (from the past 48 hour(s))  CBC with Differential     Status: Abnormal   Collection Time: 01/25/21  8:59 AM  Result Value Ref Range   WBC 8.4 4.0 - 10.5 K/uL   RBC 6.92 (H) 4.22 - 5.81 MIL/uL   Hemoglobin 18.1 (H) 13.0 - 17.0 g/dL   HCT 01/27/21 (H) 81.0 - 17.5 %   MCV 79.2 (L) 80.0 - 100.0 fL   MCH 26.2 26.0 - 34.0 pg   MCHC 33.0 30.0 - 36.0 g/dL   RDW 10.2 58.5 - 27.7 %   Platelets 184 150 - 400 K/uL   nRBC 0.0 0.0 - 0.2 %   Neutrophils Relative % 76 %   Neutro Abs 6.3 1.7 - 7.7 K/uL   Lymphocytes Relative 17 %   Lymphs Abs 1.4 0.7 - 4.0 K/uL   Monocytes Relative 7 %   Monocytes Absolute 0.6 0.1 - 1.0 K/uL   Eosinophils Relative 0 %  Eosinophils Absolute 0.0 0.0 - 0.5 K/uL   Basophils Relative 0 %   Basophils Absolute 0.0 0.0 - 0.1 K/uL   Immature Granulocytes 0 %   Abs Immature Granulocytes 0.02 0.00 - 0.07 K/uL    Comment: Performed at [facility], [address] [place], [place] [address]  Comprehensive metabolic panel     Status: Abnormal   Collection Time: 01/25/21  8:59 AM  Result Value Ref Range   Sodium 136 135 - 145 mmol/L   Potassium 3.7 3.5 - 5.1 mmol/L   Chloride 102 98 - 111 mmol/L   CO2 23 22 - 32 mmol/L   Glucose, Bld 104 (H) 70 - 99 mg/dL    Comment: Glucose reference range applies only to samples taken after fasting  for at least 8 hours.   BUN 20 6 - 20 mg/dL   Creatinine, Ser 7.59 0.61 - 1.24 mg/dL   Calcium 9.3 8.9 - 16.3 mg/dL   Total Protein 7.9 6.5 - 8.1 g/dL   Albumin 4.2 3.5 - 5.0 g/dL   AST 19 15 - 41 U/L   ALT 24 0 - 44 U/L   Alkaline Phosphatase 59 38 - 126 U/L   Total Bilirubin 0.7 0.3 - 1.2 mg/dL   GFR, Estimated >84 >66 mL/min    Comment: (NOTE) Calculated using the CKD-EPI Creatinine Equation (2021)    Anion gap 11 5 - 15    Comment: Performed at [facility], [address] [place], [place] [address]  Lipase, blood     Status: None   Collection Time: 01/25/21  8:59 AM  Result Value Ref Range   Lipase 39 11 - 51 U/L    Comment: Performed at [facility], [address] [place], [place] [address]  Urinalysis, Routine w reflex microscopic Urine, Clean Catch     Status: Abnormal   Collection Time: 01/25/21  9:15 AM  Result Value Ref Range   Color, Urine YELLOW YELLOW   APPearance CLEAR CLEAR   Specific Gravity, Urine 1.030 1.005 - 1.030   pH 6.0 5.0 - 8.0   Glucose, UA NEGATIVE NEGATIVE mg/dL   Hgb urine dipstick TRACE (A) NEGATIVE   Bilirubin Urine NEGATIVE NEGATIVE   Ketones, ur NEGATIVE NEGATIVE mg/dL   Protein, ur NEGATIVE NEGATIVE mg/dL   Nitrite NEGATIVE NEGATIVE   Leukocytes,Ua NEGATIVE NEGATIVE    Comment: Performed at [facility], [address] [place], [place] [address]  Urinalysis, Microscopic (reflex)     Status: Abnormal   Collection Time: 01/25/21  9:15 AM  Result Value Ref Range   RBC / HPF 0-5 0 - 5 RBC/hpf   WBC, UA 0-5 0 - 5 WBC/hpf   Bacteria, UA RARE (A) NONE SEEN   Squamous Epithelial / LPF 0-5 0 - 5    Comment: Performed at [facility], [address] [place], [place] [address]   CT ABDOMEN PELVIS W CONTRAST  Result Date: 01/25/2021 CLINICAL DATA:  Pain right lower quadrant EXAM: CT ABDOMEN AND PELVIS WITH CONTRAST TECHNIQUE: Multidetector CT imaging of the abdomen and pelvis was performed using  the standard protocol following bolus administration of intravenous contrast. CONTRAST:  OMNIPAQUE IOHEXOL 300 MG/ML  SOLN COMPARISON:  Images of previous study done on 12/17/2017 are not available for comparison due to technical difficulties. Report for the previous study was reviewed. FINDINGS: Lower chest: Unremarkable. Hepatobiliary: Liver measures 20.3 cm. No focal abnormality  is seen. There is no dilation of bile ducts. Gallbladder is unremarkable. Pancreas: No focal abnormality is seen. Spleen: Spleen measures 17.4 cm in maximum diameter. Adrenals/Urinary Tract: Adrenals are unremarkable. There is no hydronephrosis. There are no renal or ureteral stones. Urinary bladder is not distended. Stomach/Bowel: Stomach is not distended. Small bowel loops are not dilated. Appendix measures 9 mm in diameter. There is stranding in the fat planes adjacent to appendix. There is no loculated or free fluid in the right lower quadrant. There is no significant wall thickening in colon. Vascular/Lymphatic: Unremarkable. Reproductive: Unremarkable. Other: There is no pneumoperitoneum. There is trace amount of free fluid in the right pelvic cavity. Small umbilical hernia containing fat is seen. Musculoskeletal: There is encroachment of neural foramina at multiple levels, more so at L4-L5 and L5-S1 levels. IMPRESSION: Appendix is slightly dilated. There is mild stranding in the fat planes adjacent to the appendix. There is no evidence of any free fluid or loculated fluid collection in the right lower quadrant. Findings suggest acute appendicitis. Surgical consultation should be considered. Imaging findings were relayed to Dr. Stevie Kern by telephone call at 10:29 a.m. on 01/25/2021. Electronically Signed   By: Ernie Avena M.D.   On: 01/25/2021 10:31      Assessment/Plan Acute appendicitis - CT with dilated appendix and mild stranding in periappendiceal fat, no evidence of abscess or perforation  - WBC 8.4,  afebrile - plan to proceed to OR for laparoscopic appendectomy, likely discharge post-operatively. If patient requires admission will admit to observation   FEN: NPO VTE: none  ID: [email or web address]  Franne Forts, [facility] 01/25/2021, 11:05 AM Please see Amion for pager number during day hours 7:00am-4:30pm

## 2021-01-25 NOTE — Discharge Instructions (Signed)
[facility] LAPAROSCOPIC SURGERY: POST OP INSTRUCTIONS Always review your discharge instruction sheet given to you by the facility where your surgery was performed. IF YOU HAVE DISABILITY OR FAMILY LEAVE FORMS, YOU MUST BRING THEM TO THE OFFICE FOR PROCESSING.   DO NOT GIVE THEM TO YOUR DOCTOR.  PAIN CONTROL  First take acetaminophen (Tylenol) AND/or ibuprofen (Advil) to control your pain after surgery.  Follow directions on package.  Taking acetaminophen (Tylenol) and/or ibuprofen (Advil) regularly after surgery will help to control your pain and lower the amount of prescription pain medication you may need.  You should not take more than 3,000 mg (3 grams) of acetaminophen (Tylenol) in 24 hours.  You should not take ibuprofen (Advil), aleve, motrin, naprosyn or other NSAIDS if you have a history of stomach ulcers or chronic kidney disease.  A prescription for pain medication may be given to you upon discharge.  Take your pain medication as prescribed, if you still have uncontrolled pain after taking acetaminophen (Tylenol) or ibuprofen (Advil). Use ice packs to help control pain. If you need a refill on your pain medication, please contact your pharmacy.  They will contact our office to request authorization. Prescriptions will not be filled after 5pm or on week-ends.  HOME MEDICATIONS Take your usually prescribed medications unless otherwise directed.  DIET You should follow a light diet the first few days after arrival home.  Be sure to include lots of fluids daily. Avoid fatty, fried foods.   CONSTIPATION It is common to experience some constipation after surgery and if you are taking pain medication.  Increasing fluid intake and taking a stool softener (such as Colace) will usually help or prevent this problem from occurring.  A mild laxative (Milk of Magnesia or Miralax) should be taken according to package instructions if there are no bowel movements after 48  hours.  WOUND/INCISION CARE Most patients will experience some swelling and bruising in the area of the incisions.  Ice packs will help.  Swelling and bruising can take several days to resolve.  Unless discharge instructions indicate otherwise, follow guidelines below  STERI-STRIPS - you may remove your outer bandages 48 hours after surgery, and you may shower at that time.  You have steri-strips (small skin tapes) in place directly over the incision.  These strips should be left on the skin for 7-10 days.   DERMABOND/SKIN GLUE - you may shower in 24 hours.  The glue will flake off over the next 2-3 weeks. Any sutures or staples will be removed at the office during your follow-up visit.  ACTIVITIES You may resume regular (light) daily activities beginning the next day--such as daily self-care, walking, climbing stairs--gradually increasing activities as tolerated.  You may have sexual intercourse when it is comfortable.  Refrain from any heavy lifting or straining until approved by your doctor. You may drive when you are no longer taking prescription pain medication, you can comfortably wear a seatbelt, and you can safely maneuver your car and apply brakes.  FOLLOW-UP You should see your doctor in the office for a follow-up appointment approximately 2-3 weeks after your surgery.  You should have been given your post-op/follow-up appointment when your surgery was scheduled.  If you did not receive a post-op/follow-up appointment, make sure that you call for this appointment within a day or two after you arrive home to insure a convenient appointment time.   WHEN TO CALL YOUR DOCTOR: Fever over 101.0 Inability to urinate Continued bleeding from incision.   Increased pain, redness, or drainage from the incision. Increasing abdominal pain  The clinic staff is available to answer your questions during regular business hours.  Please don't hesitate to call and ask to speak to one of the nurses for  clinical concerns.  If you have a medical emergency, go to the nearest emergency room or call 911.  A surgeon from [facility] is always on call at the hospital. [address], [address], [place], [place]  [address] ? [address] [address], [place], [place]   [address] [phone] ? [phone] ? FAX [phone] Web site: [email or web address]      Managing Your Pain After Surgery Without Opioids    Thank you for participating in our program to help patients manage their pain after surgery without opioids. This is part of our effort to provide you with the best care possible, without exposing you or your family to the risk that opioids pose.  What pain can I expect after surgery? You can expect to have some pain after surgery. This is normal. The pain is typically worse the day after surgery, and quickly begins to get better. Many studies have found that many patients are able to manage their pain after surgery with Over-the-Counter (OTC) medications such as Tylenol and Motrin. If you have a condition that does not allow you to take Tylenol or Motrin, notify your surgical team.  How will I manage my pain? The best strategy for controlling your pain after surgery is around the clock pain control with Tylenol (acetaminophen) and Motrin (ibuprofen or Advil). Alternating these medications with each other allows you to maximize your pain control. In addition to Tylenol and Motrin, you can use heating pads or ice packs on your incisions to help reduce your pain.  How will I alternate your regular strength over-the-counter pain medication? You will take a dose of pain medication every three hours. Start by taking 650 mg of Tylenol (2 pills of 325 mg) 3 hours later take 600 mg of Motrin (3 pills of 200 mg) 3 hours after taking the Motrin take 650 mg of Tylenol 3 hours after that take 600 mg of Motrin.   - 1 -  See example - if your first dose of Tylenol is at 12:00  PM   12:00 PM Tylenol 650 mg (2 pills of 325 mg)  3:00 PM Motrin 600 mg (3 pills of 200 mg)  6:00 PM Tylenol 650 mg (2 pills of 325 mg)  9:00 PM Motrin 600 mg (3 pills of 200 mg)  Continue alternating every 3 hours   We recommend that you follow this schedule around-the-clock for at least 3 days after surgery, or until you feel that it is no longer needed. Use the table on the last page of this handout to keep track of the medications you are taking. Important: Do not take more than 3000mg of Tylenol or 3200mg of Motrin in a 24-hour period. Do not take ibuprofen/Motrin if you have a history of bleeding stomach ulcers, severe kidney disease, &/or actively taking a blood thinner  What if I still have pain? If you have pain that is not controlled with the over-the-counter pain medications (Tylenol and Motrin or Advil) you might have what we call "breakthrough" pain. You will receive a prescription for a small amount of an opioid pain medication such as Oxycodone, Tramadol, or Tylenol with Codeine. Use these opioid pills in the first 24 hours after surgery if you have breakthrough pain. Do   not take more than 1 pill every 4-6 hours.  If you still have uncontrolled pain after using all opioid pills, don't hesitate to call our staff using the number provided. We will help make sure you are managing your pain in the best way possible, and if necessary, we can provide a prescription for additional pain medication.   Day 1    Time  Name of Medication Number of pills taken  Amount of Acetaminophen  Pain Level   Comments  AM PM       AM PM       AM PM       AM PM       AM PM       AM PM       AM PM       AM PM       Total Daily amount of Acetaminophen Do not take more than  3,000 mg per day      Day 2    Time  Name of Medication Number of pills taken  Amount of Acetaminophen  Pain Level   Comments  AM PM       AM PM       AM PM       AM PM       AM PM       AM PM       AM  PM       AM PM       Total Daily amount of Acetaminophen Do not take more than  3,000 mg per day      Day 3    Time  Name of Medication Number of pills taken  Amount of Acetaminophen  Pain Level   Comments  AM PM       AM PM       AM PM       AM PM          AM PM       AM PM       AM PM       AM PM       Total Daily amount of Acetaminophen Do not take more than  3,000 mg per day      Day 4    Time  Name of Medication Number of pills taken  Amount of Acetaminophen  Pain Level   Comments  AM PM       AM PM       AM PM       AM PM       AM PM       AM PM       AM PM       AM PM       Total Daily amount of Acetaminophen Do not take more than  3,000 mg per day      Day 5    Time  Name of Medication Number of pills taken  Amount of Acetaminophen  Pain Level   Comments  AM PM       AM PM       AM PM       AM PM       AM PM       AM PM       AM PM       AM PM       Total Daily amount of Acetaminophen Do not take more than    3,000 mg per day       Day 6    Time  Name of Medication Number of pills taken  Amount of Acetaminophen  Pain Level  Comments  AM PM       AM PM       AM PM       AM PM       AM PM       AM PM       AM PM       AM PM       Total Daily amount of Acetaminophen Do not take more than  3,000 mg per day      Day 7    Time  Name of Medication Number of pills taken  Amount of Acetaminophen  Pain Level   Comments  AM PM       AM PM       AM PM       AM PM       AM PM       AM PM       AM PM       AM PM       Total Daily amount of Acetaminophen Do not take more than  3,000 mg per day        For additional information about how and where to safely dispose of unused opioid medications - https://www.morepowerfulnc.org  Disclaimer: This document contains information and/or instructional materials adapted from [facility] for the typical patient with your condition. It does not replace medical advice  from your health care provider because your experience may differ from that of the typical patient. Talk to your health care provider if you have any questions about this document, your condition or your treatment plan. Adapted from [facility]  

## 2021-01-25 NOTE — ED Provider Notes (Signed)
[facility] EMERGENCY DEPARTMENT Provider Note   CSN: [id number] Arrival date & time: 01/25/21  [id number]     History Chief Complaint  Patient presents with   Abdominal Pain    Joe Cortez is a 20 y.o. male.  Presented to the emergency room with concern for abdominal pain.  Patient reports yesterday did not have any symptoms.  Woke up this morning with pain in the middle of his stomach.  Had some associated nausea and an episode of vomiting nonbloody nonbilious.  States currently pain is moderate, central, aching, nonradiating.  No fevers or chills.  He denies any chronic medical problems, no past abdominal surgical history.  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 No pertinent surgical history.     No family history on file.  Social History   Tobacco Use   Smoking status: Never   Smokeless tobacco: Never  Substance Use Topics   Alcohol use: No   Drug use: No    Home Medications Prior to Admission medications   Medication Sig Start Date End Date Taking? Authorizing Provider  ibuprofen (ADVIL) 600 MG tablet Take 1 tablet (600 mg total) by mouth every 6 (six) hours as needed. 06/16/20   Varney Biles, MD  lidocaine (XYLOCAINE) 2 % solution Use as directed 15 mLs in the mouth or throat 3 (three) times daily as needed for mouth pain. 06/16/20   Varney Biles, MD  methocarbamol (ROBAXIN) 500 MG tablet Take 1 tablet (500 mg total) by mouth 2 (two) times daily. 09/25/20   Tedd Sias, PA    Allergies    Patient has no known allergies.  Review of Systems   Review of Systems  Constitutional:  Negative for chills and fever.  HENT:  Negative for ear pain and sore throat.   Eyes:  Negative for pain and visual disturbance.  Respiratory:  Negative for cough and  shortness of breath.   Cardiovascular:  Negative for chest pain and palpitations.  Gastrointestinal:  Positive for abdominal pain, nausea and vomiting.  Genitourinary:  Negative for dysuria and hematuria.  Musculoskeletal:  Negative for arthralgias and back pain.  Skin:  Negative for color change and rash.  Neurological:  Negative for seizures and syncope.  All other systems reviewed and are negative.  Physical Exam Updated Vital Signs BP 111/66    Pulse 82    Temp 98.1 F (36.7 C) (Oral)    Resp 18    Ht 6\' 5"  (1.956 m)    Wt (!) 144.2 kg    SpO2 100%    BMI 37.71 kg/m   Physical Exam Vitals and nursing note reviewed.  Constitutional:      General: He is not in acute distress.    Appearance: He is well-developed.  HENT:     Head: Normocephalic and atraumatic.  Eyes:     Conjunctiva/sclera: Conjunctivae normal.  Cardiovascular:     Rate and Rhythm: Normal rate and regular rhythm.     Heart sounds: No murmur heard. Pulmonary:     Effort: Pulmonary effort is normal. No respiratory distress.     Breath sounds: Normal breath sounds.  Abdominal:     Palpations: Abdomen is soft.     Tenderness: There is abdominal tenderness in the  right lower quadrant and epigastric area. There is no guarding or rebound.  Musculoskeletal:        General: No swelling.     Cervical back: Neck supple.  Skin:    General: Skin is warm and dry.     Capillary Refill: Capillary refill takes less than 2 seconds.  Neurological:     Mental Status: He is alert.  Psychiatric:        Mood and Affect: Mood normal.    ED Results / Procedures / Treatments   Labs (all labs ordered are listed, but only abnormal results are displayed) Labs Reviewed  CBC WITH DIFFERENTIAL/PLATELET - Abnormal; Notable for the following components:      Result Value   RBC 6.92 (*)    Hemoglobin 18.1 (*)    HCT 54.8 (*)    MCV 79.2 (*)    All other components within normal limits  COMPREHENSIVE METABOLIC PANEL - Abnormal;  Notable for the following components:   Glucose, Bld 104 (*)    All other components within normal limits  URINALYSIS, ROUTINE W REFLEX MICROSCOPIC - Abnormal; Notable for the following components:   Hgb urine dipstick TRACE (*)    All other components within normal limits  URINALYSIS, MICROSCOPIC (REFLEX) - Abnormal; Notable for the following components:   Bacteria, UA RARE (*)    All other components within normal limits  RESP PANEL BY RT-PCR (FLU A&B, COVID) ARPGX2  LIPASE, BLOOD    EKG None  Radiology CT ABDOMEN PELVIS W CONTRAST  Result Date: 01/25/2021 CLINICAL DATA:  Pain right lower quadrant EXAM: CT ABDOMEN AND PELVIS WITH CONTRAST TECHNIQUE: Multidetector CT imaging of the abdomen and pelvis was performed using the standard protocol following bolus administration of intravenous contrast. CONTRAST:  143mL OMNIPAQUE IOHEXOL 300 MG/ML  SOLN COMPARISON:  Images of previous study done on 12/17/2017 are not available for comparison due to technical difficulties. Report for the previous study was reviewed. FINDINGS: Lower chest: Unremarkable. Hepatobiliary: Liver measures 20.3 cm. No focal abnormality is seen. There is no dilation of bile ducts. Gallbladder is unremarkable. Pancreas: No focal abnormality is seen. Spleen: Spleen measures 17.4 cm in maximum diameter. Adrenals/Urinary Tract: Adrenals are unremarkable. There is no hydronephrosis. There are no renal or ureteral stones. Urinary bladder is not distended. Stomach/Bowel: Stomach is not distended. Small bowel loops are not dilated. Appendix measures 9 mm in diameter. There is stranding in the fat planes adjacent to appendix. There is no loculated or free fluid in the right lower quadrant. There is no significant wall thickening in colon. Vascular/Lymphatic: Unremarkable. Reproductive: Unremarkable. Other: There is no pneumoperitoneum. There is trace amount of free fluid in the right pelvic cavity. Small umbilical hernia containing fat  is seen. Musculoskeletal: There is encroachment of neural foramina at multiple levels, more so at L4-L5 and L5-S1 levels. IMPRESSION: Appendix is slightly dilated. There is mild stranding in the fat planes adjacent to the appendix. There is no evidence of any free fluid or loculated fluid collection in the right lower quadrant. Findings suggest acute appendicitis. Surgical consultation should be considered. Imaging findings were relayed to Dr. Roslynn Amble by telephone call at 10:29 a.m. on 01/25/2021. Electronically Signed   By: Elmer Picker M.D.   On: 01/25/2021 10:31    Procedures Procedures   Medications Ordered in ED Medications  cefTRIAXone (ROCEPHIN) 2 g in sodium chloride 0.9 % 100 mL IVPB (0 g Intravenous Stopped 01/25/21 1110)    And  metroNIDAZOLE (FLAGYL) IVPB 500 mg (  500 mg Intravenous New Bag/Given 01/25/21 1113)  fentaNYL (SUBLIMAZE) injection 50 mcg (has no administration in time range)  fentaNYL (SUBLIMAZE) injection 50 mcg (50 mcg Intravenous Given 01/25/21 0953)  ondansetron (ZOFRAN) injection 4 mg (4 mg Intravenous Given 01/25/21 0953)  iohexol (OMNIPAQUE) 300 MG/ML solution 100 mL (100 mLs Intravenous Contrast Given 01/25/21 1003)    ED Course  I have reviewed the triage vital signs and the nursing notes.  Pertinent labs & imaging results that were available during my care of the patient were reviewed by me and considered in my medical decision making (see chart for details).    MDM Rules/Calculators/A&P                           20 year old male presents to ER with concern for abdominal pain.  On exam he appears well in no distress, did have focal tenderness in his right lower quadrant.  Labs stable, CT scan concerning for acute appendicitis.  Started antibiotics, consulted general surgery.  Discussed with Dr. Luisa Hart at [facility].  Limited OR availability at [place] today.  He will reach out to colleagues at [facility].  Dr. Bedelia Person will accept patient to short stay at [facility].  Final Clinical Impression(s) / ED Diagnoses Final diagnoses:  Acute appendicitis with localized peritonitis, without perforation, abscess, or gangrene    Rx / DC Orders ED Discharge Orders     None        Milagros Loll, MD 01/25/21 1122

## 2021-01-25 NOTE — Anesthesia Preprocedure Evaluation (Addendum)
Anesthesia Evaluation  Patient identified by MRN, date of birth, ID band Patient awake    Reviewed: Allergy & Precautions, NPO status , Patient's Chart, lab work & pertinent test results  Airway Mallampati: I  TM Distance: >3 FB Neck ROM: Full    Dental no notable dental hx. (+) Teeth Intact, Dental Advisory Given   Pulmonary neg pulmonary ROS,    Pulmonary exam normal breath sounds clear to auscultation       Cardiovascular negative cardio ROS Normal cardiovascular exam Rhythm:Regular Rate:Normal     Neuro/Psych negative neurological ROS  negative psych ROS   GI/Hepatic negative GI ROS, Neg liver ROS,   Endo/Other  negative endocrine ROSObese BMI 38  Renal/GU negative Renal ROS  negative genitourinary   Musculoskeletal negative musculoskeletal ROS (+)   Abdominal (+) + obese,   Peds  Hematology negative hematology ROS (+)   Anesthesia Other Findings   Reproductive/Obstetrics                            Anesthesia Physical Anesthesia Plan  ASA: 2  Anesthesia Plan: General   Post-op Pain Management:    Induction: Intravenous  PONV Risk Score and Plan: Midazolam, Dexamethasone, Ondansetron, Scopolamine patch - Pre-op and Aprepitant  Airway Management Planned: Oral ETT  Additional Equipment:   Intra-op Plan:   Post-operative Plan: Extubation in OR  Informed Consent: I have reviewed the patients History and Physical, chart, labs and discussed the procedure including the risks, benefits and alternatives for the proposed anesthesia with the patient or authorized representative who has indicated his/her understanding and acceptance.     Dental advisory given  Plan Discussed with: CRNA  Anesthesia Plan Comments:        Anesthesia Quick Evaluation

## 2021-01-25 NOTE — Op Note (Signed)
° °  Operative Note   Date: 01/25/2021  Procedure: laparoscopic appendectomy  Pre-op diagnosis: acute appendicitis Post-op diagnosis: Grade 1b appendicitis  Indication and clinical history: The patient is a 20 y.o. year old male with acute appendicitis.  Surgeon: Diamantina Monks, MD Assistant: Laural Benes, [place]  Anesthesiologist: Armond Hang, MD Anesthesia: General  Findings:  Specimen: appendix EBL: <5cc Drains/Implants: none  Disposition: PACU - hemodynamically stable.  Description of procedure: The patient was positioned supine on the operating room table. Time-out was performed verifying correct patient, procedure, signature of informed consent, and administration of pre-operative antibiotics. General anesthetic induction and intubation were uneventful. Foley catheter insertion was not performed as patient voided immediately prior to the procedure . The abdomen was prepped and draped in the usual sterile fashion. An infra-umbilical incision was made using an open technique using zero vicryl stay sutures on either side of the fascia and a 30mm Hassan port inserted. After establishing pneumoperitoneum, which the patient tolerated well, the abdominal cavity was inspected and no injury of any intra-abdominal structures was identified. Two additional five millimeter ports were placed under direct visualization and using local anesthetic in the suprapubic and left lower quadrant regions. The patient was repositioned to Trendelenburg with the left side down. Further inspection of the right lower quadrant revealed no abscess or purulent fluid and grade 1b appendicitis. Dissection, mobilization, and identification of the appendix was performed. The appendix was dissected away from its mesoappendix and an endoscopic stapler used to divide the mesoappendix using a vascular load. A bowel load of the endoscopic stapler was used to staple across the appendix at its base. Both staple lines were inspected and  found to be intact and without bleeding. The appendix was placed in an endoscopic specimen retrieval bag, removed via the umbilical port site, and sent to pathology as a permanent specimen. The right lower quadrant was again inspected and hemostasis confirmed. Any remaining fluid identified was suctioned. The suprapubic and left lower quadrant ports were removed under direct visualization and hemostasis confirmed. The umbilical port was removed last after desufflating the abdomen and the fascia re-approximated using the stay sutures. Additional local anesthetic was administered at the umbilical incision site. The skin of all port sites was closed with 4-0 monocryl. Sterile dressings were applied. All sponge and instrument counts were correct at the conclusion of the procedure. The patient was awakened from anesthesia, extubated uneventfully, and transported to the PACU in good condition. There were no complications.   Upon entering the abdomen (organ space), I encountered infection of the appendix .  CASE DATA:  Type of patient?: DOW CASE (Surgical Hospitalist [facility])  Status of Case? URGENT Add On  Infection Present At Time Of Surgery (PATOS)?  INFECTION of the appendix   Diamantina Monks, MD General and Trauma Surgery [facility]

## 2021-01-25 NOTE — ED Triage Notes (Signed)
Reports mid abdominal pain , nausea and vomiting , denies diarrhea . Denies urinary symptoms . Denies cold symptoms

## 2021-01-25 NOTE — Anesthesia Postprocedure Evaluation (Signed)
Anesthesia Post Note  Patient: Joe Cortez  Procedure(s) Performed: APPENDECTOMY LAPAROSCOPIC (Abdomen)     Patient location during evaluation: PACU Anesthesia Type: General Level of consciousness: awake and alert Pain management: pain level controlled Vital Signs Assessment: post-procedure vital signs reviewed and stable Respiratory status: spontaneous breathing, nonlabored ventilation, respiratory function stable and patient connected to nasal cannula oxygen Cardiovascular status: blood pressure returned to baseline and stable Postop Assessment: no apparent nausea or vomiting Anesthetic complications: no   No notable events documented.  Last Vitals:  Vitals:   01/25/21 1622 01/25/21 1641  BP: 124/63 128/83  Pulse: (!) 50 (!) 58  Resp: 20 12  Temp:  37 C  SpO2: 97% 99%    Last Pain:  Vitals:   01/25/21 1523  TempSrc:   PainSc: Asleep                 Catie Chiao L Glenette Bookwalter

## 2021-01-25 NOTE — Anesthesia Procedure Notes (Signed)
Procedure Name: Intubation Date/Time: 01/25/2021 2:23 PM Performed by: Erick Colace, CRNA Pre-anesthesia Checklist: Patient identified, Emergency Drugs available, Suction available and Patient being monitored Patient Re-evaluated:Patient Re-evaluated prior to induction Oxygen Delivery Method: Circle system utilized Preoxygenation: Pre-oxygenation with 100% oxygen Induction Type: IV induction Ventilation: Mask ventilation without difficulty Laryngoscope Size: Mac and 4 Grade View: Grade II Tube type: Oral Tube size: 8.0 mm Number of attempts: 1 Airway Equipment and Method: Stylet and Oral airway Placement Confirmation: ETT inserted through vocal cords under direct vision, positive ETCO2 and breath sounds checked- equal and bilateral Secured at: 24 cm Tube secured with: Tape Dental Injury: Teeth and Oropharynx as per pre-operative assessment

## 2021-01-25 NOTE — Transfer of Care (Signed)
Immediate Anesthesia Transfer of Care Note  Patient: Joe Cortez  Procedure(s) Performed: APPENDECTOMY LAPAROSCOPIC (Abdomen)  Patient Location: PACU  Anesthesia Type:General  Level of Consciousness: drowsy  Airway & Oxygen Therapy: Patient Spontanous Breathing and Patient connected to face mask oxygen  Post-op Assessment: Report given to RN and Post -op Vital signs reviewed and stable  Post vital signs: Reviewed and stable  Last Vitals:  Vitals Value Taken Time  BP 139/86 01/25/21 1522  Temp    Pulse 95 01/25/21 1524  Resp    SpO2 96 % 01/25/21 1524  Vitals shown include unvalidated device data.  Last Pain:  Vitals:   01/25/21 1258  TempSrc:   PainSc: 0-No pain         Complications: No notable events documented.

## 2021-01-26 ENCOUNTER — Encounter (HOSPITAL_COMMUNITY): Payer: Self-pay | Admitting: Surgery

## 2021-01-27 LAB — SURGICAL PATHOLOGY

## 2021-02-08 ENCOUNTER — Telehealth: Payer: Self-pay | Admitting: Family Medicine

## 2021-02-08 NOTE — Telephone Encounter (Signed)
It does look like he has been dismissed- I'm not sure where to go with this with this pt.

## 2021-02-08 NOTE — Telephone Encounter (Signed)
Patient never signed a DPR allowing [place] to speak with his mother.  Should patient call the office to inquire about dismissal, I will speak with him.  After speaking with Dahlia Client, the patient's mother told her she knew about the dismissal and knew about the no shows.  She also informed Dahlia Client she knew the patient was assigned a PCP outside our network currently.  Dahlia Client advised her she could not give her any information regarding the patient.

## 2021-02-08 NOTE — Telephone Encounter (Signed)
Patient mother came in asking about dismissal letter I let her know we did not have a DPR on file for her to get information so I have her son filling one out  Patients mother did state she called to cancel both appts(whether it have been the day before or day of) confused on to why it was a dismissal if they did call about the appt

## 2021-02-08 NOTE — Telephone Encounter (Signed)
Noted  

## 2021-02-10 NOTE — Telephone Encounter (Signed)
Pt called the office and was explained for his dismissal and about his insurance that it is out of network. Pt understood, pt mentioned was sad since stated "will be missing the best doctors".

## 2021-10-31 NOTE — Telephone Encounter (Signed)
Spoke to patient's mom and explained that we could not give her information due to her not being on his DPR. Patient called and stated he was not aware of his appointments because his mom had made them and that is why he missed them. Explained to patient that he was dismissed and unfortunately he would not be able to see [facility]. He stated he understood.

## 2022-12-15 IMAGING — CT CT ABD-PELV W/ CM
2 of 4 series · 16 of 46 positions shown, 18 images · IV contrast (Omnipaque)
Comparison: Images of previous study done on 12/17/2017 are not
available for comparison due to technical difficulties. Report for
the previous study was reviewed.

CLINICAL DATA: Pain right lower quadrant

EXAM:
CT ABDOMEN AND PELVIS WITH CONTRAST
TECHNIQUE: Multidetector CT imaging of the abdomen and pelvis was performed
using the standard protocol following bolus administration of
intravenous contrast.
CONTRAST:  100mL OMNIPAQUE IOHEXOL 300 MG/ML  SOLN

[Series 2: axial st · axial · 0.98mm/px · z∈[-562,-62]mm · 13 of 110 slices shown, 15 images]
[im 5/110  soft-tissue]
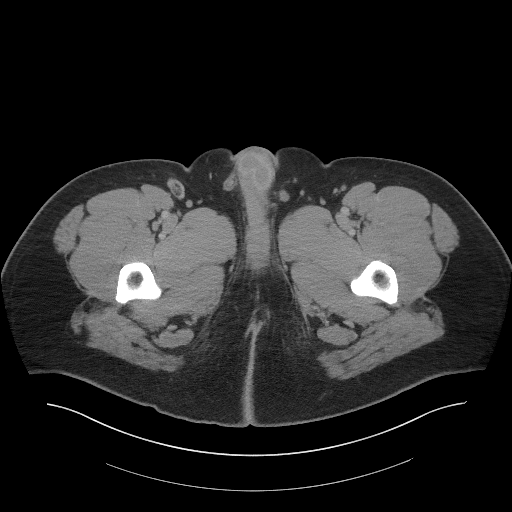
[im 5/110  bone]
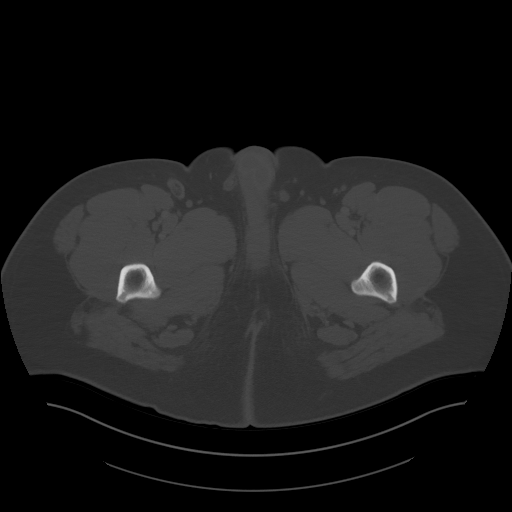
[im 14/110  soft-tissue]
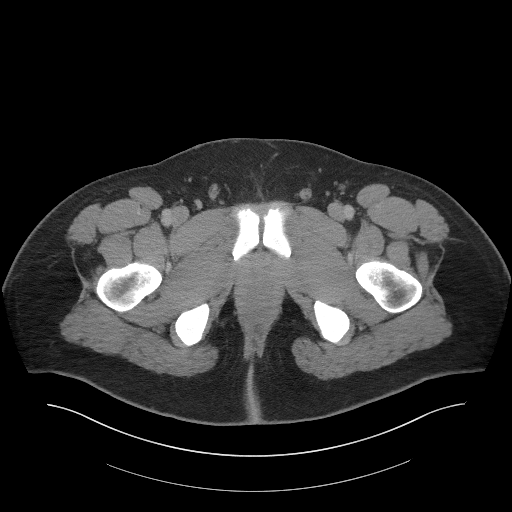
[im 22/110  soft-tissue]
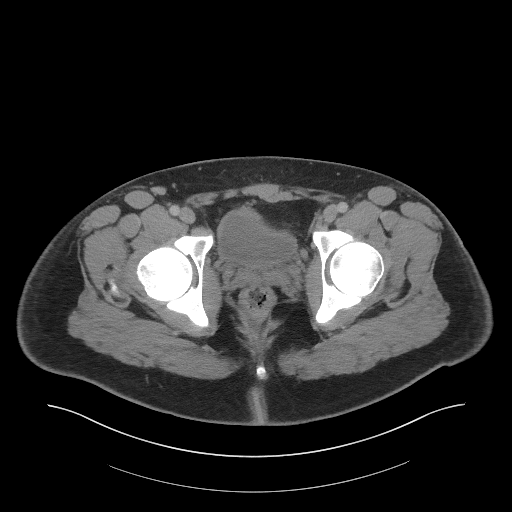
[im 31/110  soft-tissue]
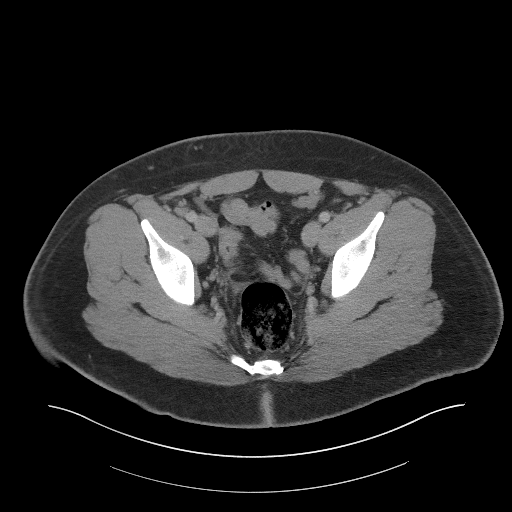
[im 40/110  soft-tissue]
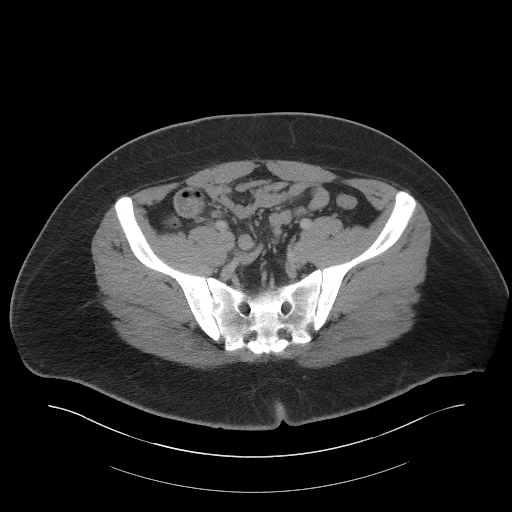
[im 48/110  soft-tissue]
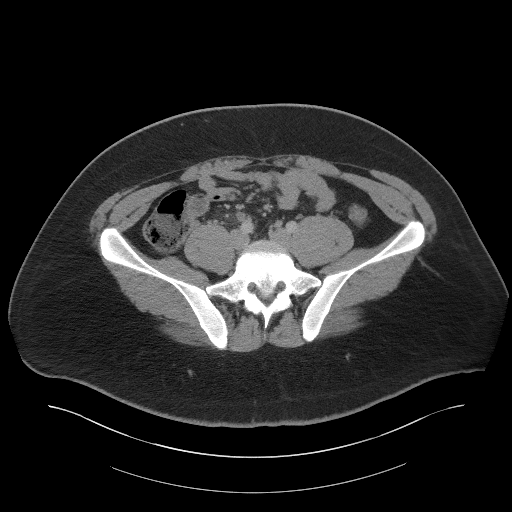
[im 57/110  soft-tissue]
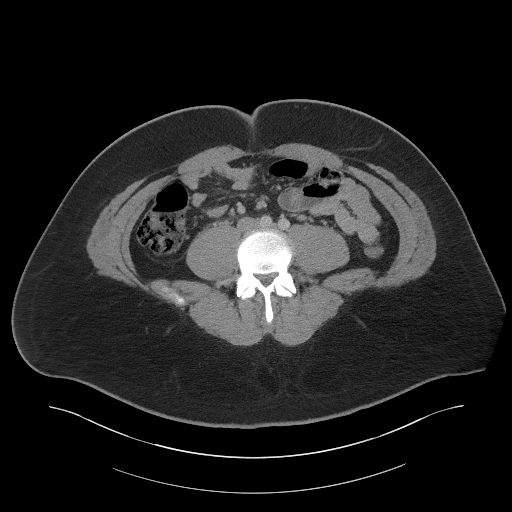
[im 62/110  soft-tissue]
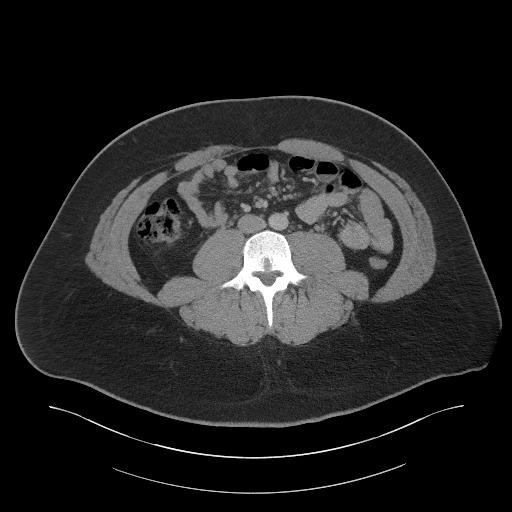
[im 70/110  soft-tissue]
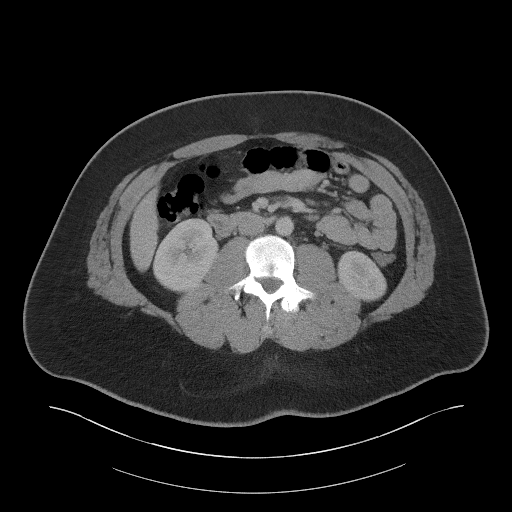
[im 70/110  bone]
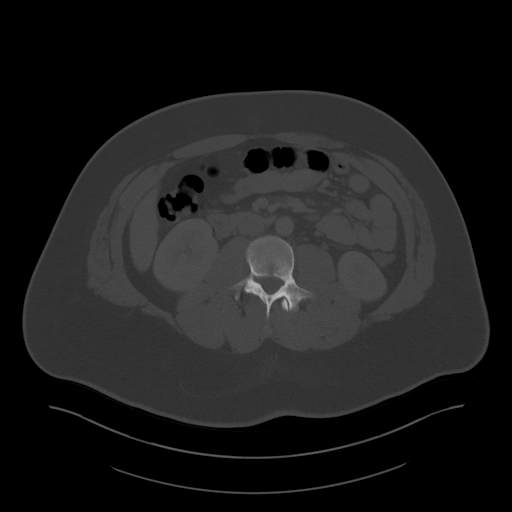
[im 79/110  soft-tissue]
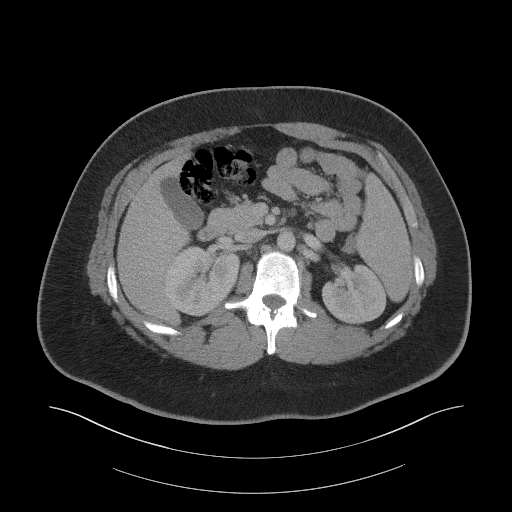
[im 88/110  soft-tissue]
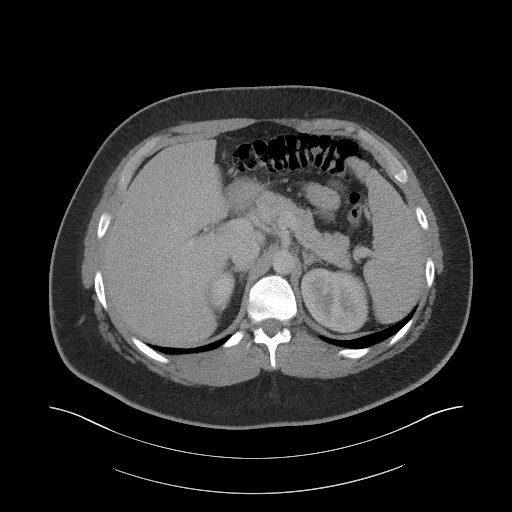
[im 96/110  soft-tissue]
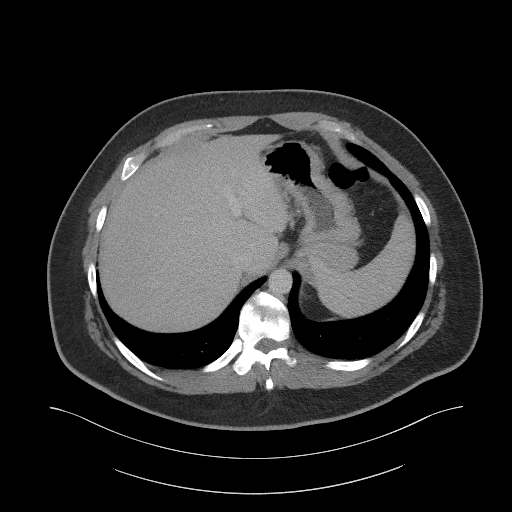
[im 105/110  soft-tissue]
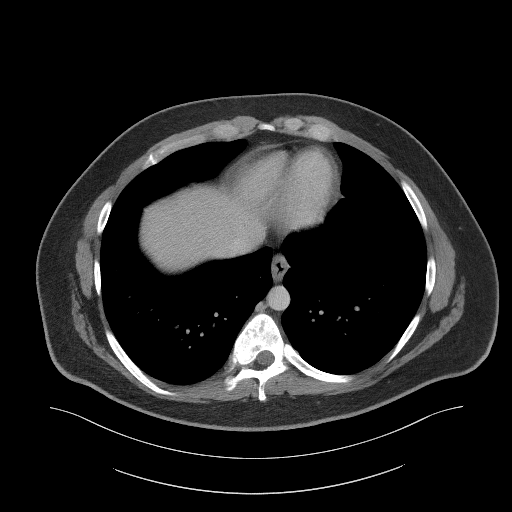

[Series 5: coronal st · coronal · 1.03mm/px · 3 of 112 slices shown]
[im 38/112  soft-tissue]
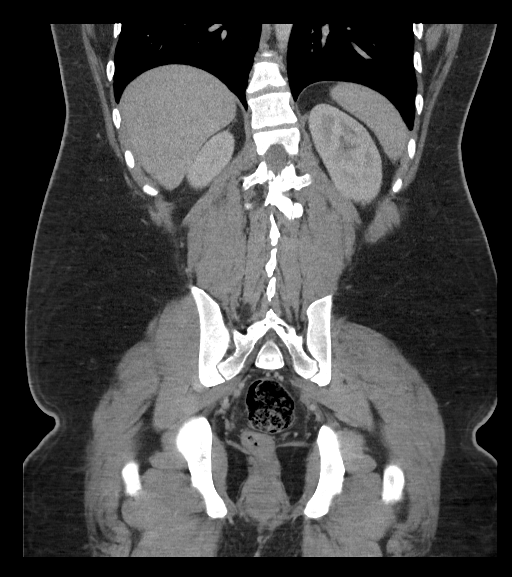
[im 50/112  soft-tissue]
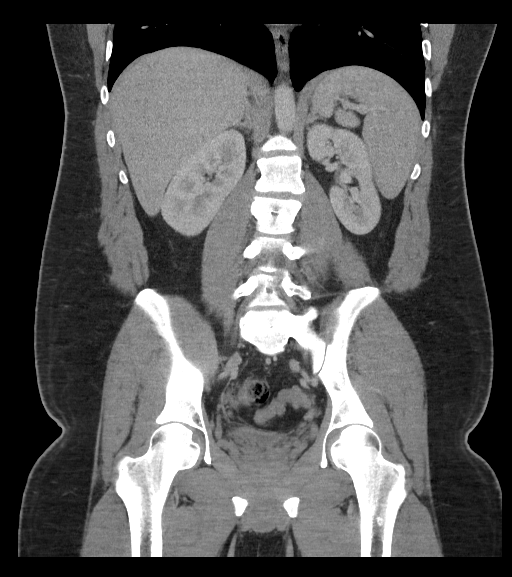
[im 62/112  soft-tissue]
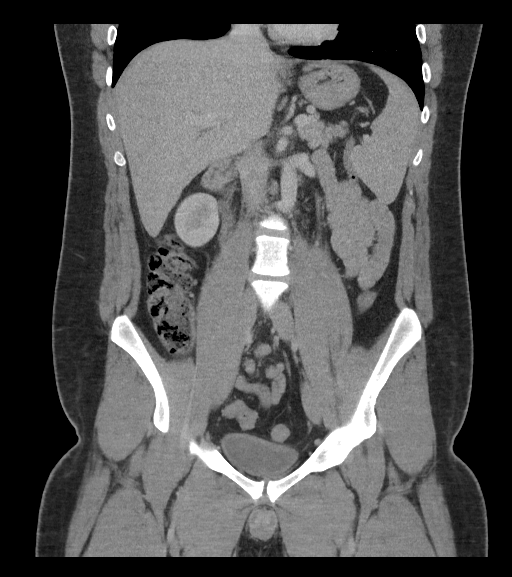

[16 of 46 positions shown; findings below may reference images not displayed]

FINDINGS: Lower chest: Unremarkable.

Hepatobiliary: Liver measures 20.3 cm. No focal abnormality is seen.
There is no dilation of bile ducts. Gallbladder is unremarkable.

Pancreas: No focal abnormality is seen.

Spleen: Spleen measures 17.4 cm in maximum diameter.

Adrenals/Urinary Tract: Adrenals are unremarkable. There is no
hydronephrosis. There are no renal or ureteral stones. Urinary
bladder is not distended.

Stomach/Bowel: Stomach is not distended. Small bowel loops are not
dilated. Appendix measures 9 mm in diameter. There is stranding in
the fat planes adjacent to appendix. There is no loculated or free
fluid in the right lower quadrant. There is no significant wall
thickening in colon.

Vascular/Lymphatic: Unremarkable.

Reproductive: Unremarkable.

Other: There is no pneumoperitoneum. There is trace amount of free
fluid in the right pelvic cavity. Small umbilical hernia containing
fat is seen.

Musculoskeletal: There is encroachment of neural foramina at
multiple levels, more so at L4-L5 and L5-S1 levels.
IMPRESSION: Appendix is slightly dilated. There is mild stranding in the fat
planes adjacent to the appendix. There is no evidence of any free
fluid or loculated fluid collection in the right lower quadrant.
Findings suggest acute appendicitis. Surgical consultation should be
considered.

Imaging findings were relayed to Dr. Bg by telephone call at

## 2023-08-22 ENCOUNTER — Ambulatory Visit
Admission: EM | Admit: 2023-08-22 | Discharge: 2023-08-22 | Disposition: A | Discharge status: Home / Self Care | Payer: Self-pay | Attending: Family Medicine | Admitting: Family Medicine

## 2023-08-22 ENCOUNTER — Other Ambulatory Visit: Payer: Self-pay

## 2023-08-22 DIAGNOSIS — B372 Candidiasis of skin and nail: Secondary | ICD-10-CM

## 2023-08-22 MED ORDER — FLUCONAZOLE 200 MG PO TABS: ORAL_TABLET | ORAL | 0 refills | Status: AC

## 2023-08-22 MED ORDER — KETOCONAZOLE 2 % EX CREA: 1.0000 | TOPICAL_CREAM | Freq: Every day | CUTANEOUS | 1 refills | Status: AC

## 2023-08-22 NOTE — Discharge Instructions (Signed)
 May use your Desitin cream and your cortisone cream if they keep you comfort Add ketoconazole  cream once a day Take Diflucan  once a week for 4 weeks See your doctor if not better in a couple weeks

## 2023-08-22 NOTE — ED Provider Notes (Signed)
 [facility]    CSN: [id number] Arrival date & time: 08/22/23  1539      History   Chief Complaint Chief Complaint  Patient presents with   Rash    HPI Joe Cortez is a 23 y.o. male.   HPI Patient has a rash under each of his arm in the armpit area.'s been going on for a month.  He has been treating it with cortisone creams and Desitin.  He is trying to keep it dry.  He has not used any soap lotion or powder.  He stopped using deodorant.  In spite of this the rash is persistent and spreading.  Denies rash elsewhere. 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 Authorizing Provider  fluconazole  (DIFLUCAN ) 200 MG tablet Take once a week for 4 weeks 08/22/23  Yes Maranda Jamee Jacob, MD  ketoconazole  (NIZORAL ) 2 % cream Apply 1 Application topically daily. 08/22/23  Yes Maranda Jamee Jacob, MD    Family History History reviewed. No pertinent family history.  Social History Social History   Tobacco Use   Smoking status: Never   Smokeless tobacco: Never  Substance Use Topics   Alcohol use: No   Drug use: No     Allergies   Corticosteroids   Review of Systems Review of Systems  See HPI Physical Exam Triage Vital Signs ED Triage Vitals  Encounter Vitals Group     BP 08/22/23 1548 122/81     Girls Systolic BP Percentile --      Girls Diastolic BP Percentile --      Boys Systolic BP Percentile --      Boys Diastolic BP Percentile --      Pulse Rate 08/22/23 1548  76     Resp 08/22/23 1548 16     Temp 08/22/23 1548 97.9 F (36.6 C)     Temp src --      SpO2 08/22/23 1548 98 %     Weight --      Height --      Head Circumference --      Peak Flow --      Pain Score 08/22/23 1551 9     Pain Loc --      Pain Education --      Exclude from Growth Chart --    No data found.  Updated Vital Signs BP 122/81   Pulse 76   Temp 97.9 F (36.6 C)   Resp 16   SpO2 98%       Physical Exam Constitutional:      General: He is not in acute distress.    Appearance: He is well-developed. He is obese. He is not ill-appearing.  HENT:     Head: Normocephalic and atraumatic.  Eyes:     Conjunctiva/sclera: Conjunctivae normal.     Pupils: Pupils are equal, round, and reactive to light.  Cardiovascular:     Rate and Rhythm: Normal rate.  Pulmonary:  Effort: Pulmonary effort is normal. No respiratory distress.  Abdominal:     General: There is no distension.     Palpations: Abdomen is soft.  Musculoskeletal:        General: Normal range of motion.     Cervical back: Normal range of motion.  Skin:    General: Skin is warm and dry.     Findings: Rash present.     Comments: There is a confluent area of rash in both axilla with satellite lesions surrounding.  There are some of the areas that are vesicular some are scaling.  Mild tenderness to touch.  Neurological:     Mental Status: He is alert.      UC Treatments / Results  Labs (all labs ordered are listed, but only abnormal results are displayed) Labs Reviewed - No data to display  EKG   Radiology No results found.  Procedures Procedures (including critical care time)  Medications Ordered in UC Medications - No data to display  Initial Impression / Assessment and Plan / UC Course  I have reviewed the triage vital signs and the nursing notes.  Pertinent labs & imaging results that were available during my care of the patient were reviewed by me and considered in my medical  decision making (see chart for details).     This looks like it may be a candidal intertrigo.  Difficult to tell without additional testing.  Will treat with fungal medicines and have him return if he fails to improve Final Clinical Impressions(s) / UC Diagnoses   Final diagnoses:  Candidal intertrigo     Discharge Instructions      May use your Desitin cream and your cortisone cream if they keep you comfort Add ketoconazole  cream once a day Take Diflucan  once a week for 4 weeks See your doctor if not better in a couple weeks   ED Prescriptions     Medication Sig Dispense Auth. Provider   fluconazole  (DIFLUCAN ) 200 MG tablet Take once a week for 4 weeks 4 tablet Maranda Jamee Jacob, MD   ketoconazole  (NIZORAL ) 2 % cream Apply 1 Application topically daily. 15 g Maranda Jamee Jacob, MD      PDMP not reviewed this encounter.   Maranda Jamee Jacob, MD 08/22/23 [phone]

## 2023-08-22 NOTE — ED Triage Notes (Signed)
 Red rash to bil axilla x 2 months, worse over last 2 weeks. Burning sensation, itches as well. Cortisone, desitin, A & D prn.
